# Patient Record
Sex: Male | Born: 1960 | Race: White | Hispanic: No | Marital: Married | Smoking: Former smoker
Health system: Southern US, Community
[De-identification: ages and names within clinical notes are randomized; demographics above are authoritative.]

## PROBLEM LIST (undated history)

## (undated) DIAGNOSIS — K648 Other hemorrhoids: Secondary | ICD-10-CM

## (undated) DIAGNOSIS — E785 Hyperlipidemia, unspecified: Secondary | ICD-10-CM

## (undated) DIAGNOSIS — M1990 Unspecified osteoarthritis, unspecified site: Secondary | ICD-10-CM

## (undated) DIAGNOSIS — Z86010 Personal history of colonic polyps: Secondary | ICD-10-CM

## (undated) DIAGNOSIS — T7840XA Allergy, unspecified, initial encounter: Secondary | ICD-10-CM

## (undated) DIAGNOSIS — K219 Gastro-esophageal reflux disease without esophagitis: Secondary | ICD-10-CM

## (undated) DIAGNOSIS — I10 Essential (primary) hypertension: Secondary | ICD-10-CM

## (undated) DIAGNOSIS — K5790 Diverticulosis of intestine, part unspecified, without perforation or abscess without bleeding: Secondary | ICD-10-CM

## (undated) DIAGNOSIS — E119 Type 2 diabetes mellitus without complications: Secondary | ICD-10-CM

## (undated) DIAGNOSIS — C801 Malignant (primary) neoplasm, unspecified: Secondary | ICD-10-CM

## (undated) DIAGNOSIS — Z860101 Personal history of adenomatous and serrated colon polyps: Secondary | ICD-10-CM

## (undated) DIAGNOSIS — D509 Iron deficiency anemia, unspecified: Secondary | ICD-10-CM

## (undated) HISTORY — PX: CERVICAL SPINE SURGERY: SHX589

## (undated) HISTORY — PX: OTHER SURGICAL HISTORY: SHX169

## (undated) HISTORY — DX: Malignant (primary) neoplasm, unspecified: C80.1

## (undated) HISTORY — DX: Personal history of adenomatous and serrated colon polyps: Z86.0101

## (undated) HISTORY — DX: Other hemorrhoids: K64.8

## (undated) HISTORY — DX: Type 2 diabetes mellitus without complications: E11.9

## (undated) HISTORY — DX: Hyperlipidemia, unspecified: E78.5

## (undated) HISTORY — PX: COLONOSCOPY: SHX174

## (undated) HISTORY — PX: APPENDECTOMY: SHX54

## (undated) HISTORY — DX: Personal history of colonic polyps: Z86.010

## (undated) HISTORY — DX: Unspecified osteoarthritis, unspecified site: M19.90

## (undated) HISTORY — PX: POLYPECTOMY: SHX149

## (undated) HISTORY — DX: Gastro-esophageal reflux disease without esophagitis: K21.9

## (undated) HISTORY — PX: MULTIPLE TOOTH EXTRACTIONS: SHX2053

## (undated) HISTORY — DX: Iron deficiency anemia, unspecified: D50.9

## (undated) HISTORY — DX: Diverticulosis of intestine, part unspecified, without perforation or abscess without bleeding: K57.90

## (undated) HISTORY — PX: ELBOW SURGERY: SHX618

## (undated) HISTORY — DX: Essential (primary) hypertension: I10

## (undated) HISTORY — DX: Allergy, unspecified, initial encounter: T78.40XA

---

## 2000-07-31 ENCOUNTER — Emergency Department (HOSPITAL_COMMUNITY): Admission: EM | Admit: 2000-07-31 | Discharge: 2000-07-31 | Payer: Self-pay | Admitting: Emergency Medicine

## 2001-06-23 ENCOUNTER — Emergency Department (HOSPITAL_COMMUNITY): Admission: EM | Admit: 2001-06-23 | Discharge: 2001-06-23 | Payer: Self-pay | Admitting: *Deleted

## 2001-12-18 ENCOUNTER — Emergency Department (HOSPITAL_COMMUNITY): Admission: EM | Admit: 2001-12-18 | Discharge: 2001-12-18 | Payer: Self-pay | Admitting: *Deleted

## 2001-12-26 ENCOUNTER — Emergency Department (HOSPITAL_COMMUNITY): Admission: EM | Admit: 2001-12-26 | Discharge: 2001-12-26 | Payer: Self-pay | Admitting: Emergency Medicine

## 2007-06-26 ENCOUNTER — Ambulatory Visit (HOSPITAL_COMMUNITY): Admission: RE | Admit: 2007-06-26 | Discharge: 2007-06-26 | Payer: Self-pay | Admitting: Neurosurgery

## 2009-10-31 ENCOUNTER — Ambulatory Visit: Payer: Self-pay | Admitting: Cardiology

## 2009-10-31 ENCOUNTER — Observation Stay (HOSPITAL_COMMUNITY): Admission: EM | Admit: 2009-10-31 | Discharge: 2009-11-01 | Payer: Self-pay | Admitting: Emergency Medicine

## 2009-11-06 ENCOUNTER — Emergency Department (HOSPITAL_COMMUNITY): Admission: EM | Admit: 2009-11-06 | Discharge: 2009-11-07 | Payer: Self-pay | Admitting: Emergency Medicine

## 2009-11-13 ENCOUNTER — Ambulatory Visit (HOSPITAL_COMMUNITY): Admission: RE | Admit: 2009-11-13 | Discharge: 2009-11-13 | Payer: Self-pay | Admitting: Cardiovascular Disease

## 2010-12-08 LAB — CBC
HCT: 45.2 % (ref 39.0–52.0)
Hemoglobin: 15.6 g/dL (ref 13.0–17.0)
Hemoglobin: 15.8 g/dL (ref 13.0–17.0)
MCHC: 34.8 g/dL (ref 30.0–36.0)
MCHC: 34.9 g/dL (ref 30.0–36.0)
MCHC: 35 g/dL (ref 30.0–36.0)
MCV: 90.9 fL (ref 78.0–100.0)
MCV: 91.2 fL (ref 78.0–100.0)
Platelets: 235 K/uL (ref 150–400)
Platelets: 244 10*3/uL (ref 150–400)
RBC: 4.97 MIL/uL (ref 4.22–5.81)
RDW: 12.6 % (ref 11.5–15.5)
RDW: 12.6 % (ref 11.5–15.5)
WBC: 12.3 10*3/uL — ABNORMAL HIGH (ref 4.0–10.5)
WBC: 13.5 10*3/uL — ABNORMAL HIGH (ref 4.0–10.5)
WBC: 9.7 10*3/uL (ref 4.0–10.5)

## 2010-12-08 LAB — POCT I-STAT, CHEM 8
BUN: 18 mg/dL (ref 6–23)
Calcium, Ion: 1.15 mmol/L (ref 1.12–1.32)
Chloride: 105 mEq/L (ref 96–112)
Glucose, Bld: 94 mg/dL (ref 70–99)
Potassium: 4.1 mEq/L (ref 3.5–5.1)
Sodium: 137 mEq/L (ref 135–145)
TCO2: 26 mmol/L (ref 0–100)

## 2010-12-08 LAB — BASIC METABOLIC PANEL
CO2: 26 mEq/L (ref 19–32)
Creatinine, Ser: 1.28 mg/dL (ref 0.4–1.5)
Glucose, Bld: 91 mg/dL (ref 70–99)
Potassium: 4.6 mEq/L (ref 3.5–5.1)
Sodium: 136 mEq/L (ref 135–145)

## 2010-12-08 LAB — TROPONIN I
Troponin I: 0.01 ng/mL (ref 0.00–0.06)
Troponin I: <0.01 ng/mL (ref 0.00–0.06)

## 2010-12-08 LAB — URINALYSIS, ROUTINE W REFLEX MICROSCOPIC
Glucose, UA: NEGATIVE mg/dL
Nitrite: NEGATIVE
Protein, ur: NEGATIVE mg/dL
Urobilinogen, UA: 0.2 mg/dL (ref 0.0–1.0)

## 2010-12-08 LAB — CK TOTAL AND CKMB (NOT AT ARMC)
CK, MB: 0.9 ng/mL (ref 0.3–4.0)
CK, MB: 1.7 ng/mL (ref 0.3–4.0)
Relative Index: 0.8 (ref 0.0–2.5)
Relative Index: 1 (ref 0.0–2.5)
Total CK: 115 U/L (ref 7–232)
Total CK: 162 U/L (ref 7–232)

## 2010-12-08 LAB — PROTIME-INR
INR: 0.92 (ref 0.00–1.49)
INR: 0.95 (ref 0.00–1.49)
Prothrombin Time: 12.3 seconds (ref 11.6–15.2)
Prothrombin Time: 12.6 seconds (ref 11.6–15.2)

## 2010-12-08 LAB — COMPREHENSIVE METABOLIC PANEL
ALT: 36 U/L (ref 0–53)
AST: 24 U/L (ref 0–37)
Alkaline Phosphatase: 49 U/L (ref 39–117)
BUN: 16 mg/dL (ref 6–23)
CO2: 24 mEq/L (ref 19–32)
Calcium: 10 mg/dL (ref 8.4–10.5)
Chloride: 104 mEq/L (ref 96–112)
GFR calc Af Amer: >60 mL/min (ref >60)
GFR calc non Af Amer: >60 mL/min (ref >60)
Potassium: 4.2 mEq/L (ref 3.5–5.1)
Sodium: 136 mEq/L (ref 135–145)
Total Bilirubin: 0.4 mg/dL (ref 0.3–1.2)

## 2010-12-08 LAB — DIFFERENTIAL
Basophils Absolute: 0 10*3/uL (ref 0.0–0.1)
Basophils Absolute: 0.1 10*3/uL (ref 0.0–0.1)
Basophils Relative: 0 % (ref 0–1)
Eosinophils Absolute: 0 10*3/uL (ref 0.0–0.7)
Eosinophils Relative: 0 % (ref 0–5)
Lymphocytes Relative: 15 % (ref 12–46)
Lymphocytes Relative: 19 % (ref 12–46)
Lymphs Abs: 1.8 K/uL (ref 0.7–4.0)
Lymphs Abs: 2.6 10*3/uL (ref 0.7–4.0)
Monocytes Absolute: 0.5 K/uL (ref 0.1–1.0)
Monocytes Relative: 4 % (ref 3–12)
Neutro Abs: 10.1 10*3/uL — ABNORMAL HIGH (ref 1.7–7.7)
Neutro Abs: 9.9 10*3/uL — ABNORMAL HIGH (ref 1.7–7.7)
Neutrophils Relative %: 81 % — ABNORMAL HIGH (ref 43–77)

## 2010-12-08 LAB — COMPREHENSIVE METABOLIC PANEL WITH GFR
Albumin: 4.6 g/dL (ref 3.5–5.2)
Creatinine, Ser: 1 mg/dL (ref 0.4–1.5)
Glucose, Bld: 101 mg/dL — ABNORMAL HIGH (ref 70–99)
Total Protein: 7.8 g/dL (ref 6.0–8.3)

## 2010-12-08 LAB — CARDIAC PANEL(CRET KIN+CKTOT+MB+TROPI)
CK, MB: 1 ng/mL (ref 0.3–4.0)
CK, MB: 1.2 ng/mL (ref 0.3–4.0)
Troponin I: <0.01 ng/mL (ref 0.00–0.06)

## 2010-12-08 LAB — APTT: aPTT: 32 seconds (ref 24–37)

## 2010-12-08 LAB — TSH: TSH: 3.379 u[IU]/mL (ref 0.350–4.500)

## 2011-02-01 NOTE — Op Note (Signed)
[name], Steven Gates                ACCOUNT NO.:  [id number]   MEDICAL RECORD NO.:  [id number]          PATIENT TYPE:  AMB   LOCATION:  SDS                          FACILITY:  [facility]   PHYSICIAN:  Clydene Fake, M.D.  DATE OF BIRTH:  Jul 15, 1961   DATE OF PROCEDURE:  06/26/2007  DATE OF DISCHARGE:                               OPERATIVE REPORT   DIAGNOSIS:  Left ulnar neuropathy.   POSTOPERATIVE DIAGNOSIS:  Left ulnar neuropathy.   PROCEDURE:  Left ulnar nerve transposition.   SURGEON:  Clydene Fake, M.D.   General endotracheal tube anesthesia.   ESTIMATED BLOOD LOSS:  Minimal.   BLOOD GIVEN:  None.   DRAINS:  None.   INDICATIONS FOR PROCEDURE:  The patient is a 50 year old gentleman who  has been having left arm numbness, weakness, progression of atrophy.  EMG nerve conduction velocity showed a severe left ulnar nerve  compression at the elbow.  The was patient brought in for ulnar nerve  decompression and transposition.   PROCEDURE IN DETAIL:  The patient was brought into the operating room,  general anesthesia was induced.  The patient was prepped and draped in a  sterile fashion.  An incision was made down the left arm lateral into  the medial epicondyle.  Incision through the skin, hemostasis was  obtained with Bovie cauterization.  We dissected out to the medial  epicondyle, found the ulnar nerve and exposed the nerve, put a vessel  loop a couple of them around the nerve and mobilized the nerve root.  I  mobilized it up proximally so that we could move the nerve laterally.  There appeared intermuscular septum was then under the nerve and this  was transected so that there was no compression.  Then the ulnar nerve  in its new position, we dissected distally to the nerve out of the ulnar  groove.  The vessel lies over the groove so the nerve could not take it  back into it.  We then fashioned a fascia sling for the nerve in its new  position, sutured this with 2-0  Vicryl interrupted suture, then closed  the skin with 2-0 and 3-0 Vicryl interrupted sutures.  The skin closed  with benzoin and Steri-Strips.  Dressing placed and the patient awoken  from anesthesia and transferred to the recovery room in stable  condition.           ______________________________  Clydene Fake, M.D.     JRH/MEDQ  D:  06/26/2007  T:  06/26/2007  Job:  [id number]

## 2011-03-01 IMAGING — CR DG CHEST 1V PORT
1 series · 1 of 1 positions shown · non-contrast
Comparison: Chest x-ray of 06/26/2007

CLINICAL DATA: Chest pain radiating to the left shoulder,
hypertension

PORTABLE CHEST - 1 VIEW

[AP]
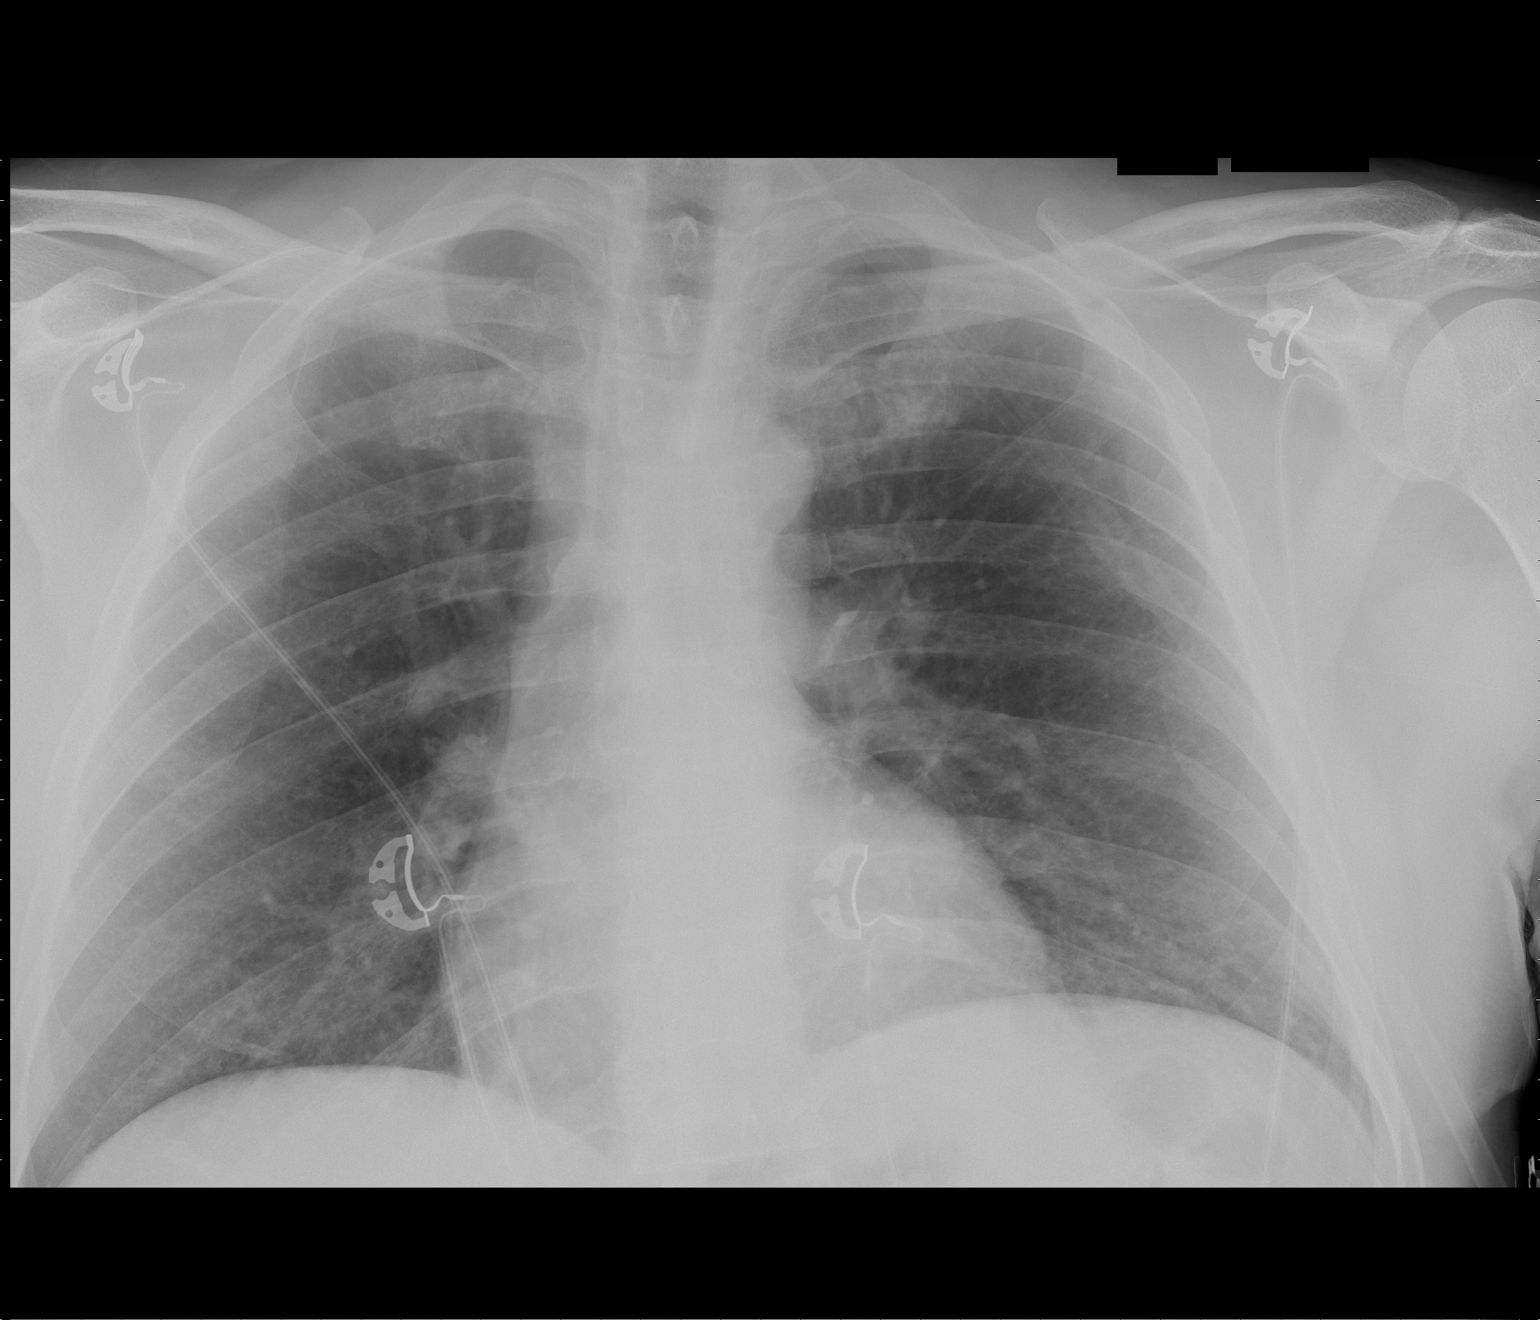

[1 of 1 positions shown; findings below may reference images not displayed]

FINDINGS: The lungs are clear.  The heart is within normal limits
in size.  No bony abnormality is seen.
IMPRESSION: Stable chest x-ray.  No active lung disease.

## 2011-03-25 ENCOUNTER — Other Ambulatory Visit: Payer: Self-pay | Admitting: Orthopaedic Surgery

## 2011-03-25 DIAGNOSIS — R202 Paresthesia of skin: Secondary | ICD-10-CM

## 2011-03-25 DIAGNOSIS — M542 Cervicalgia: Secondary | ICD-10-CM

## 2011-03-25 DIAGNOSIS — R200 Anesthesia of skin: Secondary | ICD-10-CM

## 2011-03-28 ENCOUNTER — Ambulatory Visit
Admission: RE | Admit: 2011-03-28 | Discharge: 2011-03-28 | Disposition: A | Discharge status: Home / Self Care | Payer: BC Managed Care – PPO | Source: Ambulatory Visit | Attending: Orthopaedic Surgery | Admitting: Orthopaedic Surgery

## 2011-03-28 DIAGNOSIS — M542 Cervicalgia: Secondary | ICD-10-CM

## 2011-03-28 DIAGNOSIS — R200 Anesthesia of skin: Secondary | ICD-10-CM

## 2011-06-30 LAB — URINALYSIS, ROUTINE W REFLEX MICROSCOPIC
Bilirubin Urine: NEGATIVE
Hgb urine dipstick: NEGATIVE
Nitrite: NEGATIVE
Specific Gravity, Urine: 1.017
pH: 6

## 2011-06-30 LAB — COMPREHENSIVE METABOLIC PANEL
AST: 25
Albumin: 4.2
Alkaline Phosphatase: 51
Chloride: 104
GFR calc Af Amer: >60
Potassium: 4.1
Sodium: 138
Total Bilirubin: 0.7

## 2011-06-30 LAB — CBC
Platelets: 275
WBC: 7.4

## 2011-11-21 ENCOUNTER — Ambulatory Visit
Admission: RE | Admit: 2011-11-21 | Discharge: 2011-11-21 | Disposition: A | Discharge status: Home / Self Care | Payer: BC Managed Care – PPO | Source: Ambulatory Visit | Attending: Neurological Surgery | Admitting: Neurological Surgery

## 2011-11-21 ENCOUNTER — Other Ambulatory Visit: Payer: Self-pay | Admitting: Neurological Surgery

## 2011-11-21 DIAGNOSIS — M542 Cervicalgia: Secondary | ICD-10-CM

## 2013-01-23 ENCOUNTER — Other Ambulatory Visit: Payer: Self-pay | Admitting: Neurological Surgery

## 2013-01-23 DIAGNOSIS — M545 Low back pain, unspecified: Secondary | ICD-10-CM

## 2013-01-26 ENCOUNTER — Ambulatory Visit
Admission: RE | Admit: 2013-01-26 | Discharge: 2013-01-26 | Disposition: A | Discharge status: Home / Self Care | Payer: BC Managed Care – PPO | Source: Ambulatory Visit | Attending: Neurological Surgery | Admitting: Neurological Surgery

## 2013-01-26 DIAGNOSIS — M545 Low back pain, unspecified: Secondary | ICD-10-CM

## 2013-02-08 ENCOUNTER — Other Ambulatory Visit: Payer: Self-pay | Admitting: Orthopedic Surgery

## 2013-02-08 DIAGNOSIS — M549 Dorsalgia, unspecified: Secondary | ICD-10-CM

## 2013-02-12 ENCOUNTER — Ambulatory Visit
Admission: RE | Admit: 2013-02-12 | Discharge: 2013-02-12 | Disposition: A | Discharge status: Home / Self Care | Payer: BC Managed Care – PPO | Source: Ambulatory Visit | Attending: Orthopedic Surgery | Admitting: Orthopedic Surgery

## 2013-02-12 VITALS — BP 121/76 | HR 61

## 2013-02-12 DIAGNOSIS — M549 Dorsalgia, unspecified: Secondary | ICD-10-CM

## 2013-02-12 MED ORDER — MEPERIDINE HCL 100 MG/ML IJ SOLN
75.0000 mg | Freq: Once | INTRAMUSCULAR | Status: AC
  Administered 2013-02-12: 75 mg via INTRAMUSCULAR

## 2013-02-12 MED ORDER — IOHEXOL 180 MG/ML  SOLN
15.0000 mL | Freq: Once | INTRAMUSCULAR | Status: AC | PRN
  Administered 2013-02-12: 15 mL via INTRATHECAL

## 2013-02-12 MED ORDER — ONDANSETRON HCL 4 MG/2ML IJ SOLN
4.0000 mg | Freq: Once | INTRAMUSCULAR | Status: AC
  Administered 2013-02-12: 4 mg via INTRAMUSCULAR

## 2013-02-12 MED ORDER — DIAZEPAM 5 MG PO TABS
10.0000 mg | ORAL_TABLET | Freq: Once | ORAL | Status: AC
  Administered 2013-02-12: 10 mg via ORAL

## 2013-02-18 ENCOUNTER — Other Ambulatory Visit: Payer: BC Managed Care – PPO

## 2013-02-19 ENCOUNTER — Other Ambulatory Visit: Payer: BC Managed Care – PPO

## 2014-03-10 ENCOUNTER — Ambulatory Visit
Admission: RE | Admit: 2014-03-10 | Discharge: 2014-03-10 | Disposition: A | Discharge status: Home / Self Care | Payer: BC Managed Care – PPO | Source: Ambulatory Visit | Attending: Family Medicine | Admitting: Family Medicine

## 2014-03-10 ENCOUNTER — Other Ambulatory Visit: Payer: Self-pay | Admitting: Family Medicine

## 2014-03-10 DIAGNOSIS — M25552 Pain in left hip: Secondary | ICD-10-CM

## 2014-03-10 DIAGNOSIS — M79644 Pain in right finger(s): Secondary | ICD-10-CM

## 2014-04-18 ENCOUNTER — Other Ambulatory Visit (HOSPITAL_COMMUNITY): Payer: Self-pay | Admitting: Orthopedic Surgery

## 2014-04-18 DIAGNOSIS — D481 Neoplasm of uncertain behavior of connective and other soft tissue: Secondary | ICD-10-CM

## 2014-05-09 ENCOUNTER — Ambulatory Visit (HOSPITAL_COMMUNITY)
Admission: RE | Admit: 2014-05-09 | Discharge: 2014-05-09 | Disposition: A | Discharge status: Home / Self Care | Payer: BC Managed Care – PPO | Source: Ambulatory Visit | Attending: Orthopedic Surgery | Admitting: Orthopedic Surgery

## 2014-05-09 DIAGNOSIS — D481 Neoplasm of uncertain behavior of connective and other soft tissue: Secondary | ICD-10-CM

## 2014-05-09 DIAGNOSIS — M799 Soft tissue disorder, unspecified: Secondary | ICD-10-CM | POA: Diagnosis not present

## 2017-08-07 DIAGNOSIS — Z23 Encounter for immunization: Secondary | ICD-10-CM | POA: Diagnosis not present

## 2017-08-07 DIAGNOSIS — K219 Gastro-esophageal reflux disease without esophagitis: Secondary | ICD-10-CM | POA: Diagnosis not present

## 2017-08-07 DIAGNOSIS — E785 Hyperlipidemia, unspecified: Secondary | ICD-10-CM | POA: Diagnosis not present

## 2017-08-07 DIAGNOSIS — I10 Essential (primary) hypertension: Secondary | ICD-10-CM | POA: Diagnosis not present

## 2017-08-07 DIAGNOSIS — E119 Type 2 diabetes mellitus without complications: Secondary | ICD-10-CM | POA: Diagnosis not present

## 2018-01-25 DIAGNOSIS — I10 Essential (primary) hypertension: Secondary | ICD-10-CM | POA: Diagnosis not present

## 2018-01-25 DIAGNOSIS — Z23 Encounter for immunization: Secondary | ICD-10-CM | POA: Diagnosis not present

## 2018-01-25 DIAGNOSIS — Z125 Encounter for screening for malignant neoplasm of prostate: Secondary | ICD-10-CM | POA: Diagnosis not present

## 2018-01-25 DIAGNOSIS — Z0000 Encounter for general adult medical examination without abnormal findings: Secondary | ICD-10-CM | POA: Diagnosis not present

## 2018-01-25 DIAGNOSIS — E7800 Pure hypercholesterolemia, unspecified: Secondary | ICD-10-CM | POA: Diagnosis not present

## 2018-01-25 DIAGNOSIS — E119 Type 2 diabetes mellitus without complications: Secondary | ICD-10-CM | POA: Diagnosis not present

## 2018-01-26 ENCOUNTER — Encounter: Payer: Self-pay | Admitting: Internal Medicine

## 2018-02-09 DIAGNOSIS — D485 Neoplasm of uncertain behavior of skin: Secondary | ICD-10-CM | POA: Diagnosis not present

## 2018-02-09 DIAGNOSIS — D2261 Melanocytic nevi of right upper limb, including shoulder: Secondary | ICD-10-CM | POA: Diagnosis not present

## 2018-02-09 DIAGNOSIS — D225 Melanocytic nevi of trunk: Secondary | ICD-10-CM | POA: Diagnosis not present

## 2018-02-09 DIAGNOSIS — L814 Other melanin hyperpigmentation: Secondary | ICD-10-CM | POA: Diagnosis not present

## 2018-02-09 DIAGNOSIS — C4441 Basal cell carcinoma of skin of scalp and neck: Secondary | ICD-10-CM | POA: Diagnosis not present

## 2018-02-09 DIAGNOSIS — D2271 Melanocytic nevi of right lower limb, including hip: Secondary | ICD-10-CM | POA: Diagnosis not present

## 2018-02-09 DIAGNOSIS — L918 Other hypertrophic disorders of the skin: Secondary | ICD-10-CM | POA: Diagnosis not present

## 2018-03-06 DIAGNOSIS — C4441 Basal cell carcinoma of skin of scalp and neck: Secondary | ICD-10-CM | POA: Diagnosis not present

## 2018-04-05 ENCOUNTER — Other Ambulatory Visit: Payer: Self-pay

## 2018-04-05 ENCOUNTER — Ambulatory Visit (AMBULATORY_SURGERY_CENTER): Payer: Self-pay | Admitting: *Deleted

## 2018-04-05 VITALS — Ht 65.0 in | Wt 169.8 lb

## 2018-04-05 DIAGNOSIS — Z1211 Encounter for screening for malignant neoplasm of colon: Secondary | ICD-10-CM

## 2018-04-05 MED ORDER — SUPREP BOWEL PREP KIT 17.5-3.13-1.6 GM/177ML PO SOLN: 1.0000 | Freq: Once | ORAL | 0 refills | Status: AC

## 2018-04-05 NOTE — Progress Notes (Signed)
Patient denies any allergies to egg or soy products. Patient denies complications with anesthesia/sedation.  Patient denies oxygen use at home and denies diet medications. Pamphlet given on colonoscopy. 

## 2018-04-19 ENCOUNTER — Encounter: Payer: Self-pay | Admitting: Internal Medicine

## 2018-04-19 ENCOUNTER — Ambulatory Visit (AMBULATORY_SURGERY_CENTER): Payer: BLUE CROSS/BLUE SHIELD | Admitting: Internal Medicine

## 2018-04-19 VITALS — BP 115/89 | HR 62 | Temp 98.9°F | Resp 12 | Ht 65.0 in | Wt 169.0 lb

## 2018-04-19 DIAGNOSIS — D123 Benign neoplasm of transverse colon: Secondary | ICD-10-CM | POA: Diagnosis not present

## 2018-04-19 DIAGNOSIS — D125 Benign neoplasm of sigmoid colon: Secondary | ICD-10-CM

## 2018-04-19 DIAGNOSIS — D124 Benign neoplasm of descending colon: Secondary | ICD-10-CM | POA: Diagnosis not present

## 2018-04-19 DIAGNOSIS — Z1211 Encounter for screening for malignant neoplasm of colon: Secondary | ICD-10-CM | POA: Diagnosis not present

## 2018-04-19 DIAGNOSIS — K635 Polyp of colon: Secondary | ICD-10-CM

## 2018-04-19 MED ORDER — SODIUM CHLORIDE 0.9 % IV SOLN: 500.0000 mL | Freq: Once | INTRAVENOUS | Status: DC

## 2018-04-19 NOTE — Progress Notes (Signed)
Report to PACU, RN, vss, BBS= Clear.  

## 2018-04-19 NOTE — Progress Notes (Signed)
Pt's states no medical or surgical changes since previsit or office visit. 

## 2018-04-19 NOTE — Patient Instructions (Signed)
**   Handouts given on polyps and diverticulosis **   YOU HAD AN ENDOSCOPIC PROCEDURE TODAY AT THE [facility]:   Refer to the procedure report that was given to you for any specific questions about what was found during the examination.  If the procedure report does not answer your questions, please call your gastroenterologist to clarify.  If you requested that your care partner not be given the details of your procedure findings, then the procedure report has been included in a sealed envelope for you to review at your convenience later.  YOU SHOULD EXPECT: Some feelings of bloating in the abdomen. Passage of more gas than usual.  Walking can help get rid of the air that was put into your GI tract during the procedure and reduce the bloating. If you had a lower endoscopy (such as a colonoscopy or flexible sigmoidoscopy) you may notice spotting of blood in your stool or on the toilet paper. If you underwent a bowel prep for your procedure, you may not have a normal bowel movement for a few days.  Please Note:  You might notice some irritation and congestion in your nose or some drainage.  This is from the oxygen used during your procedure.  There is no need for concern and it should clear up in a day or so.  SYMPTOMS TO REPORT IMMEDIATELY:   Following lower endoscopy (colonoscopy or flexible sigmoidoscopy):  Excessive amounts of blood in the stool  Significant tenderness or worsening of abdominal pains  Swelling of the abdomen that is new, acute  Fever of 100F or higher  For urgent or emergent issues, a gastroenterologist can be reached at any hour by calling [phone].   DIET:  We do recommend a small meal at first, but then you may proceed to your regular diet.  Drink plenty of fluids but you should avoid alcoholic beverages for 24 hours.  ACTIVITY:  You should plan to take it easy for the rest of today and you should NOT DRIVE or use heavy machinery until tomorrow (because  of the sedation medicines used during the test).    FOLLOW UP: Our staff will call the number listed on your records the next business day following your procedure to check on you and address any questions or concerns that you may have regarding the information given to you following your procedure. If we do not reach you, we will leave a message.  However, if you are feeling well and you are not experiencing any problems, there is no need to return our call.  We will assume that you have returned to your regular daily activities without incident.  If any biopsies were taken you will be contacted by phone or by letter within the next 1-3 weeks.  Please call us at [phone] if you have not heard about the biopsies in 3 weeks.    SIGNATURES/CONFIDENTIALITY: You and/or your care partner have signed paperwork which will be entered into your electronic medical record.  These signatures attest to the fact that that the information above on your After Visit Summary has been reviewed and is understood.  Full responsibility of the confidentiality of this discharge information lies with you and/or your care-partner. 

## 2018-04-19 NOTE — Progress Notes (Signed)
Called to room to assist during endoscopic procedure.  Patient ID and intended procedure confirmed with present staff. Received instructions for my participation in the procedure from the performing physician.  

## 2018-04-19 NOTE — Op Note (Signed)
[place] Patient Name: Steven Gates Procedure Date: 04/19/2018 11:21 AM MRN: [id number] Endoscopist: Jerene Bears , MD Age: 57 Referring MD:  Date of Birth: 09-28-1960 Gender: Male Account #: [id number] Procedure:                Colonoscopy Indications:              Screening for colorectal malignant neoplasm Medicines:                Monitored Anesthesia Care Procedure:                Pre-Anesthesia Assessment:                           - Prior to the procedure, a History and Physical                            was performed, and patient medications and                            allergies were reviewed. The patient's tolerance of                            previous anesthesia was also reviewed. The risks                            and benefits of the procedure and the sedation                            options and risks were discussed with the patient.                            All questions were answered, and informed consent                            was obtained. Prior Anticoagulants: The patient has                            taken no previous anticoagulant or antiplatelet                            agents. ASA Grade Assessment: II - A patient with                            mild systemic disease. After reviewing the risks                            and benefits, the patient was deemed in                            satisfactory condition to undergo the procedure.                           After obtaining informed consent, the colonoscope  was passed under direct vision. Throughout the                            procedure, the patient's blood pressure, pulse, and                            oxygen saturations were monitored continuously. The                            Colonoscope was introduced through the anus and                            advanced to the cecum, identified by appendiceal                            orifice and ileocecal  valve. The colonoscopy was                            performed without difficulty. The patient tolerated                            the procedure well. The quality of the bowel                            preparation was good. The ileocecal valve,                            appendiceal orifice, and rectum were photographed. Scope In: 11:28:00 AM Scope Out: 11:52:23 AM Scope Withdrawal Time: 0 hours 18 minutes 50 seconds  Total Procedure Duration: 0 hours 24 minutes 23 seconds  Findings:                 The digital rectal exam was normal.                           Seven sessile polyps were found in the transverse                            colon. The polyps were 3 to 7 mm in size. These                            polyps were removed with a cold snare. Resection                            and retrieval were complete.                           Two sessile polyps were found in the descending                            colon. The polyps were 3 to 6 mm in size. These                            polyps  were removed with a cold snare. Resection                            and retrieval were complete.                           Three sessile polyps were found in the sigmoid                            colon. The polyps were 4 to 6 mm in size. These                            polyps were removed with a cold snare. Resection                            and retrieval were complete.                           Multiple small and large-mouthed diverticula were                            found in the sigmoid colon and descending colon.                            There is angulation in the rectosigmoid due to                            diverticulosis.                           Internal hemorrhoids were found during                            retroflexion. The hemorrhoids were small. Complications:            No immediate complications. Estimated Blood Loss:     Estimated blood loss was minimal. Impression:                - Seven 3 to 7 mm polyps in the transverse colon,                            removed with a cold snare. Resected and retrieved.                           - Two 3 to 6 mm polyps in the descending colon,                            removed with a cold snare. Resected and retrieved.                           - Three 4 to 6 mm polyps in the sigmoid colon,                            removed with a cold snare. Resected  and retrieved.                           - Moderate diverticulosis in the sigmoid colon and                            in the descending colon.                           - Small internal hemorrhoids. Recommendation:           - Patient has a contact number available for                            emergencies. The signs and symptoms of potential                            delayed complications were discussed with the                            patient. Return to normal activities tomorrow.                            Written discharge instructions were provided to the                            patient.                           - Resume previous diet.                           - Continue present medications.                           - Await pathology results.                           - Repeat colonoscopy is recommended for                            surveillance of multiple polyps. The colonoscopy                            date will be determined after pathology results                            from today's exam become available for review. Jerene Bears, MD 04/19/2018 11:57:00 AM This report has been signed electronically.

## 2018-04-20 ENCOUNTER — Telehealth: Payer: Self-pay

## 2018-04-20 NOTE — Telephone Encounter (Signed)
  Follow up Call-  Call back number 04/19/2018  Post procedure Call Back phone  # [phone]  Permission to leave phone message Yes  Some recent data might be hidden     Patient questions:  Do you have a fever, pain , or abdominal swelling? No. Pain Score  0 *  Have you tolerated food without any problems? Yes.    Have you been able to return to your normal activities? Yes.    Do you have any questions about your discharge instructions: Diet   No. Medications  No. Follow up visit  No.  Do you have questions or concerns about your Care? No.  Actions: * If pain score is 4 or above: No action needed, pain <4.

## 2018-04-30 ENCOUNTER — Encounter: Payer: Self-pay | Admitting: Internal Medicine

## 2018-10-31 DIAGNOSIS — E119 Type 2 diabetes mellitus without complications: Secondary | ICD-10-CM | POA: Diagnosis not present

## 2018-10-31 DIAGNOSIS — J101 Influenza due to other identified influenza virus with other respiratory manifestations: Secondary | ICD-10-CM | POA: Diagnosis not present

## 2018-10-31 DIAGNOSIS — E7800 Pure hypercholesterolemia, unspecified: Secondary | ICD-10-CM | POA: Diagnosis not present

## 2018-10-31 DIAGNOSIS — R6889 Other general symptoms and signs: Secondary | ICD-10-CM | POA: Diagnosis not present

## 2018-10-31 DIAGNOSIS — J019 Acute sinusitis, unspecified: Secondary | ICD-10-CM | POA: Diagnosis not present

## 2019-01-31 DIAGNOSIS — Z0000 Encounter for general adult medical examination without abnormal findings: Secondary | ICD-10-CM | POA: Diagnosis not present

## 2019-01-31 DIAGNOSIS — E785 Hyperlipidemia, unspecified: Secondary | ICD-10-CM | POA: Diagnosis not present

## 2019-01-31 DIAGNOSIS — E119 Type 2 diabetes mellitus without complications: Secondary | ICD-10-CM | POA: Diagnosis not present

## 2019-01-31 DIAGNOSIS — I10 Essential (primary) hypertension: Secondary | ICD-10-CM | POA: Diagnosis not present

## 2019-01-31 DIAGNOSIS — K219 Gastro-esophageal reflux disease without esophagitis: Secondary | ICD-10-CM | POA: Diagnosis not present

## 2019-02-22 DIAGNOSIS — Z125 Encounter for screening for malignant neoplasm of prostate: Secondary | ICD-10-CM | POA: Diagnosis not present

## 2019-02-22 DIAGNOSIS — E119 Type 2 diabetes mellitus without complications: Secondary | ICD-10-CM | POA: Diagnosis not present

## 2019-02-22 DIAGNOSIS — I10 Essential (primary) hypertension: Secondary | ICD-10-CM | POA: Diagnosis not present

## 2019-04-20 ENCOUNTER — Encounter: Payer: Self-pay | Admitting: Internal Medicine

## 2019-05-13 DIAGNOSIS — T07XXXA Unspecified multiple injuries, initial encounter: Secondary | ICD-10-CM | POA: Diagnosis not present

## 2019-05-13 DIAGNOSIS — R079 Chest pain, unspecified: Secondary | ICD-10-CM | POA: Diagnosis not present

## 2019-06-14 ENCOUNTER — Encounter: Payer: Self-pay | Admitting: Internal Medicine

## 2019-06-25 DIAGNOSIS — E785 Hyperlipidemia, unspecified: Secondary | ICD-10-CM | POA: Insufficient documentation

## 2019-06-25 DIAGNOSIS — C801 Malignant (primary) neoplasm, unspecified: Secondary | ICD-10-CM | POA: Insufficient documentation

## 2019-06-25 DIAGNOSIS — E119 Type 2 diabetes mellitus without complications: Secondary | ICD-10-CM | POA: Insufficient documentation

## 2019-06-25 DIAGNOSIS — K219 Gastro-esophageal reflux disease without esophagitis: Secondary | ICD-10-CM | POA: Insufficient documentation

## 2019-06-25 DIAGNOSIS — I10 Essential (primary) hypertension: Secondary | ICD-10-CM | POA: Insufficient documentation

## 2019-07-08 ENCOUNTER — Other Ambulatory Visit: Payer: Self-pay

## 2019-07-08 ENCOUNTER — Ambulatory Visit (AMBULATORY_SURGERY_CENTER): Payer: BC Managed Care – PPO | Admitting: *Deleted

## 2019-07-08 VITALS — Temp 97.1°F | Ht 65.0 in | Wt 170.0 lb

## 2019-07-08 DIAGNOSIS — E119 Type 2 diabetes mellitus without complications: Secondary | ICD-10-CM

## 2019-07-08 DIAGNOSIS — C801 Malignant (primary) neoplasm, unspecified: Secondary | ICD-10-CM

## 2019-07-08 DIAGNOSIS — Z86010 Personal history of colonic polyps: Secondary | ICD-10-CM

## 2019-07-08 DIAGNOSIS — Z1159 Encounter for screening for other viral diseases: Secondary | ICD-10-CM

## 2019-07-08 MED ORDER — SUPREP BOWEL PREP KIT 17.5-3.13-1.6 GM/177ML PO SOLN: 1.0000 | Freq: Once | ORAL | 0 refills | Status: AC

## 2019-07-08 NOTE — Progress Notes (Signed)
No egg or soy allergy known to patient  No issues with past sedation with any surgeries  or procedures, no intubation problems  No diet pills per patient No home 02 use per patient  No blood thinners per patient  Pt denies issues with constipation  No A fib or A flutter  EMMI video sent to pt's e mail  Suprep $15 coupon to pt   Due to the COVID-19 pandemic we are asking patients to follow these guidelines. Please only bring one care partner. Please be aware that your care partner may wait in the car in the parking lot or if they feel like they will be too hot to wait in the car, they may wait in the lobby on the 4th floor. All care partners are required to wear a mask the entire time (we do not have any that we can provide them), they need to practice social distancing, and we will do a Covid check for all patient's and care partners when you arrive. Also we will check their temperature and your temperature. If the care partner waits in their car they need to stay in the parking lot the entire time and we will call them on their cell phone when the patient is ready for discharge so they can bring the car to the front of the building. Also all patient's will need to wear a mask into building.

## 2019-07-09 ENCOUNTER — Encounter: Payer: Self-pay | Admitting: Internal Medicine

## 2019-07-17 ENCOUNTER — Other Ambulatory Visit: Payer: Self-pay | Admitting: Internal Medicine

## 2019-07-17 DIAGNOSIS — Z1159 Encounter for screening for other viral diseases: Secondary | ICD-10-CM | POA: Diagnosis not present

## 2019-07-18 LAB — SARS CORONAVIRUS 2 (TAT 6-24 HRS): SARS Coronavirus 2: NEGATIVE

## 2019-07-22 ENCOUNTER — Ambulatory Visit (AMBULATORY_SURGERY_CENTER): Payer: BC Managed Care – PPO | Admitting: Internal Medicine

## 2019-07-22 ENCOUNTER — Other Ambulatory Visit: Payer: Self-pay

## 2019-07-22 ENCOUNTER — Encounter: Payer: Self-pay | Admitting: Internal Medicine

## 2019-07-22 VITALS — BP 110/70 | HR 62 | Temp 97.7°F | Resp 18 | Ht 65.0 in | Wt 170.0 lb

## 2019-07-22 DIAGNOSIS — Z86010 Personal history of colonic polyps: Secondary | ICD-10-CM

## 2019-07-22 DIAGNOSIS — D124 Benign neoplasm of descending colon: Secondary | ICD-10-CM

## 2019-07-22 MED ORDER — SODIUM CHLORIDE 0.9 % IV SOLN: 500.0000 mL | Freq: Once | INTRAVENOUS | Status: DC

## 2019-07-22 NOTE — Patient Instructions (Signed)
YOU HAD AN ENDOSCOPIC PROCEDURE TODAY AT THE [facility]:   Refer to the procedure report that was given to you for any specific questions about what was found during the examination.  If the procedure report does not answer your questions, please call your gastroenterologist to clarify.  If you requested that your care partner not be given the details of your procedure findings, then the procedure report has been included in a sealed envelope for you to review at your convenience later.  YOU SHOULD EXPECT: Some feelings of bloating in the abdomen. Passage of more gas than usual.  Walking can help get rid of the air that was put into your GI tract during the procedure and reduce the bloating. If you had a lower endoscopy (such as a colonoscopy or flexible sigmoidoscopy) you may notice spotting of blood in your stool or on the toilet paper. If you underwent a bowel prep for your procedure, you may not have a normal bowel movement for a few days.  Please Note:  You might notice some irritation and congestion in your nose or some drainage.  This is from the oxygen used during your procedure.  There is no need for concern and it should clear up in a day or so.  SYMPTOMS TO REPORT IMMEDIATELY:   Following lower endoscopy (colonoscopy or flexible sigmoidoscopy):  Excessive amounts of blood in the stool  Significant tenderness or worsening of abdominal pains  Swelling of the abdomen that is new, acute  Fever of 100F or higher  For urgent or emergent issues, a gastroenterologist can be reached at any hour by calling [phone].   DIET:  We do recommend a small meal at first, but then you may proceed to your regular diet.  Drink plenty of fluids but you should avoid alcoholic beverages for 24 hours.  ACTIVITY:  You should plan to take it easy for the rest of today and you should NOT DRIVE or use heavy machinery until tomorrow (because of the sedation medicines used during the test).     FOLLOW UP: Our staff will call the number listed on your records 48-72 hours following your procedure to check on you and address any questions or concerns that you may have regarding the information given to you following your procedure. If we do not reach you, we will leave a message.  We will attempt to reach you two times.  During this call, we will ask if you have developed any symptoms of COVID 19. If you develop any symptoms (ie: fever, flu-like symptoms, shortness of breath, cough etc.) before then, please call [phone].  If you test positive for Covid 19 in the 2 weeks post procedure, please call and report this information to us.    If any biopsies were taken you will be contacted by phone or by letter within the next 1-3 weeks.  Please call us at [phone] if you have not heard about the biopsies in 3 weeks.    SIGNATURES/CONFIDENTIALITY: You and/or your care partner have signed paperwork which will be entered into your electronic medical record.  These signatures attest to the fact that that the information above on your After Visit Summary has been reviewed and is understood.  Full responsibility of the confidentiality of this discharge information lies with you and/or your care-partner. 

## 2019-07-22 NOTE — Progress Notes (Signed)
Called to room to assist during endoscopic procedure.  Patient ID and intended procedure confirmed with present staff. Received instructions for my participation in the procedure from the performing physician.  

## 2019-07-22 NOTE — Progress Notes (Signed)
A/ox3, pleased with MAC, report to RN 

## 2019-07-22 NOTE — Progress Notes (Signed)
Temp check by:LC Vital check by:CW  The patient states no changes in medical or surgical history since pre-visit screening on 07/08/2019.

## 2019-07-22 NOTE — Op Note (Signed)
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 The patient's tolerance of                            previous anesthesia was also reviewed. The risks                            and benefits of the procedure and the sedation                            options and risks were discussed with the patient.                            All questions were answered, and informed consent                            was obtained. Prior Anticoagulants: The patient has                            taken no previous anticoagulant or antiplatelet                            agents. ASA Grade Assessment: II - A patient with                            mild systemic disease. After reviewing the risks                            and benefits, the patient was deemed in                            satisfactory condition to undergo the procedure.  After obtaining informed consent, the colonoscope                            was passed under direct vision. Throughout the                            procedure, the patient's blood pressure, pulse, and                            oxygen saturations were monitored continuously. The                            Colonoscope was introduced through the anus and                            advanced to the  terminal ileum. The colonoscopy was                            performed without difficulty. The patient tolerated                            the procedure well. The quality of the bowel                            preparation was good. The terminal ileum, ileocecal                            valve, appendiceal orifice, and rectum were                            photographed. Scope In: 9:58:40 AM Scope Out: 10:17:49 AM Scope Withdrawal Time: 0 hours 14 minutes 6 seconds  Total Procedure Duration: 0 hours 19 minutes 9 seconds  Findings:                 The digital rectal exam was normal.                           A 4 mm polyp was found in the descending colon. The                            polyp was sessile. The polyp was removed with a                            cold snare. Resection and retrieval were complete.                           Multiple small and large-mouthed diverticula were                            found in the sigmoid colon and descending colon.                            Petechia were visualized in association with the  diverticular opening.                           Internal hemorrhoids were found during                            retroflexion. The hemorrhoids were small. Complications:            No immediate complications. Estimated Blood Loss:     Estimated blood loss was minimal. Impression:               - One 4 mm polyp in the descending colon, removed                            with a cold snare. Resected and retrieved.                           - Mild diverticulosis in the sigmoid colon and in                            the descending colon. Petechia were visualized in                            association with the diverticular opening.                           - Internal hemorrhoids. Recommendation:           - Patient has a contact number available for                            emergencies. The signs and symptoms of potential                             delayed complications were discussed with the                            patient. Return to normal activities tomorrow.                            Written discharge instructions were provided to the                            patient.                           - Resume previous diet.                           - Continue present medications.                           - Await pathology results.                           - Repeat colonoscopy is recommended for  surveillance. The colonoscopy date will be                            determined after pathology results from today's                            exam become available for review. Jerene Bears, MD 07/22/2019 10:27:23 AM This report has been signed electronically.

## 2019-07-24 ENCOUNTER — Telehealth: Payer: Self-pay

## 2019-07-24 NOTE — Telephone Encounter (Signed)
  Follow up Call-  Call back number 07/22/2019 04/19/2018  Post procedure Call Back phone  # [phone]  Permission to leave phone message Yes Yes  Some recent data might be hidden     Patient questions:  Do you have a fever, pain , or abdominal swelling? No. Pain Score  0 *  Have you tolerated food without any problems? Yes.    Have you been able to return to your normal activities? Yes.    Do you have any questions about your discharge instructions: Diet   No. Medications  No. Follow up visit  No.  Do you have questions or concerns about your Care? No.  Actions: * If pain score is 4 or above: No action needed, pain <4. 1. Have you developed a fever since your procedure? no  2.   Have you had an respiratory symptoms (SOB or cough) since your procedure? no  3.   Have you tested positive for COVID 19 since your procedure no  4.   Have you had any family members/close contacts diagnosed with the COVID 19 since your procedure?  no   If yes to any of these questions please route to Joylene John, RN and Alphonsa Gin, Therapist, sports.

## 2019-07-26 ENCOUNTER — Encounter: Payer: Self-pay | Admitting: Internal Medicine

## 2019-10-31 DIAGNOSIS — K219 Gastro-esophageal reflux disease without esophagitis: Secondary | ICD-10-CM | POA: Diagnosis not present

## 2019-10-31 DIAGNOSIS — I10 Essential (primary) hypertension: Secondary | ICD-10-CM | POA: Diagnosis not present

## 2019-10-31 DIAGNOSIS — E7800 Pure hypercholesterolemia, unspecified: Secondary | ICD-10-CM | POA: Diagnosis not present

## 2019-10-31 DIAGNOSIS — E119 Type 2 diabetes mellitus without complications: Secondary | ICD-10-CM | POA: Diagnosis not present

## 2019-11-04 DIAGNOSIS — E119 Type 2 diabetes mellitus without complications: Secondary | ICD-10-CM | POA: Diagnosis not present

## 2019-11-04 DIAGNOSIS — E7800 Pure hypercholesterolemia, unspecified: Secondary | ICD-10-CM | POA: Diagnosis not present

## 2019-11-04 DIAGNOSIS — Z7984 Long term (current) use of oral hypoglycemic drugs: Secondary | ICD-10-CM | POA: Diagnosis not present

## 2020-02-21 DIAGNOSIS — E1165 Type 2 diabetes mellitus with hyperglycemia: Secondary | ICD-10-CM | POA: Diagnosis not present

## 2020-03-06 ENCOUNTER — Ambulatory Visit: Payer: BC Managed Care – PPO | Admitting: Orthopedic Surgery

## 2020-03-11 ENCOUNTER — Ambulatory Visit: Payer: Self-pay

## 2020-03-11 ENCOUNTER — Ambulatory Visit (INDEPENDENT_AMBULATORY_CARE_PROVIDER_SITE_OTHER): Payer: BC Managed Care – PPO | Admitting: Orthopedic Surgery

## 2020-03-11 DIAGNOSIS — G8929 Other chronic pain: Secondary | ICD-10-CM

## 2020-03-11 DIAGNOSIS — M79601 Pain in right arm: Secondary | ICD-10-CM

## 2020-03-11 DIAGNOSIS — M25511 Pain in right shoulder: Secondary | ICD-10-CM | POA: Diagnosis not present

## 2020-03-14 ENCOUNTER — Encounter: Payer: Self-pay | Admitting: Orthopedic Surgery

## 2020-03-14 NOTE — Progress Notes (Signed)
Office Visit Note   [name]           Date of Birth: 1961/01/27           MRN: [id number] Visit Date: 03/11/2020 Requested by: Shirline Frees, MD [address] [place],  [place] [address] PCP: Shirline Frees, MD  Subjective: Chief Complaint  Patient presents with  . Neck - Pain  . Right Shoulder - Pain    HPI: Steven Gates is a 59 y.o. male who presents to the office complaining of right shoulder pain.  Patient notes pain for the last year.  He cannot recall specific injury leading to onset of pain.  He is right-hand dominant.  He localizes pain to the diffuse shoulder and notes it is a "deep pain".  He has occasional radiation to the elbow when pain is severe.  Pain is worse with lifting, abduction, crossover.  Pain ranges from a 2-6 out of 10.  No history of shoulder surgery or MRI.  He has had 1 previous injection by Dr. Ninfa Linden.  Pain is waking him up at night.  He does note some neck pain but denies any radiculopathy.  He has occasional numbness/tingling in his 5 fingers.  No subjective weakness or stiffness of the shoulder.  He does note mechanical symptoms with grinding of the shoulder with range of motion.  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                ROS:  All systems reviewed are negative as they relate to the chief complaint within the history of present illness.  Patient denies fevers or chills.  Assessment & Plan: Visit Diagnoses:  1. Right arm pain   2. Chronic right shoulder pain     Plan: Patient is a 59 year old male who presents complaint of right shoulder pain.  He has had 1 year of pain duration.  Pain is primarily in the right shoulder with occasional radiation to the elbow.  He has no subjective weakness and no weakness on exam.  No stiffness.  He does note mechanical symptoms.  He has positive O'Brien's test on exam.  No left shoulder symptoms.  Impression is  SLAP tear.  Ordered MRI arthrogram right shoulder to further evaluate for SLAP tear.  Follow-up after scan to review results.  Follow-Up Instructions: No follow-ups on file.   Orders:  Orders Placed This Encounter  Procedures  . XR Shoulder Right  . XR Cervical Spine 2 or 3 views  . MR SHOULDER RIGHT W CONTRAST  . Arthrogram   No orders of the defined types were placed in this encounter.     Procedures: No procedures performed   Clinical Data: No additional findings.  Objective: Vital Signs: There were no vitals taken for this visit.  Physical Exam:  Constitutional: Patient appears well-developed HEENT:  Head: Normocephalic Eyes:EOM are normal Neck: Normal range of motion Cardiovascular: Normal rate Pulmonary/chest: Effort normal Neurologic: Patient is alert Skin: Skin is warm Psychiatric: Patient has normal mood and affect  Ortho Exam:  Right shoulder Exam 160 degrees forward flexion, 110 degrees abduction, 45 degrees external rotation No loss of ER relative to the other shoulder.  Good endpoint with ER No significant tenderness to palpation over the [facility] joint.  Mild to moderate tenderness to palpation over the bicipital groove Positive O'Brien's test. Good subscapularis, supraspinatus, and infraspinatus strength Negative Hawkins impingement 5/5 grip strength,  forearm pronation/supination, and bicep strength No tenderness to palpation throughout the axial cervical spine.  No pain with cervical spine range of motion  Specialty Comments:  No specialty comments available.  Imaging: No results found.   PMFS History: Patient Active Problem List   Diagnosis Date Noted  . Hypertension   . Hyperlipidemia   . GERD (gastroesophageal reflux disease)   . Diabetes mellitus without complication ([place])   . Cancer [facility])    Past Medical History:  Diagnosis Date  . Allergy    mild  . Arthritis    hands   . Cancer ([place])    skin cancer - spot removed from neck  .  Diabetes mellitus without complication ([place])   . GERD (gastroesophageal reflux disease)   . Hx of adenomatous colonic polyps    x 12  on 04-19-2018  . Hyperlipidemia   . Hypertension     Family History  Problem Relation Age of Onset  . Colon cancer Sister        in her 67s  . Colon polyps Sister   . Breast cancer Sister   . Esophageal cancer Sister   . Stomach cancer Neg Hx   . Rectal cancer Neg Hx     Past Surgical History:  Procedure Laterality Date  . APPENDECTOMY    . carpel tunnel Right   . CERVICAL SPINE SURGERY     C3-C4 - plate  . COLONOSCOPY     greater than 10 yrs  in [place]  . ELBOW SURGERY Bilateral    elbow surgery x 2 - bilateral ulnar nerve  . MULTIPLE TOOTH EXTRACTIONS    . POLYPECTOMY     Social History   Occupational History  . Not on file  Tobacco Use  . Smoking status: Former Smoker    Packs/day: 1.00    Years: 25.00    Pack years: 25.00    Types: Cigarettes    Quit date: 2014    Years since quitting: 7.4  . Smokeless tobacco: Never Used  Vaping Use  . Vaping Use: Never used  Substance and Sexual Activity  . Alcohol use: Never  . Drug use: Not Currently    Types: Marijuana    Comment: Last use March 22 2018  . Sexual activity: Not on file

## 2020-03-18 ENCOUNTER — Ambulatory Visit: Payer: BC Managed Care – PPO | Admitting: Orthopedic Surgery

## 2020-04-13 ENCOUNTER — Ambulatory Visit
Admission: RE | Admit: 2020-04-13 | Discharge: 2020-04-13 | Disposition: A | Discharge status: Home / Self Care | Payer: BC Managed Care – PPO | Source: Ambulatory Visit | Attending: Orthopedic Surgery | Admitting: Orthopedic Surgery

## 2020-04-13 ENCOUNTER — Other Ambulatory Visit: Payer: Self-pay

## 2020-04-13 DIAGNOSIS — M25511 Pain in right shoulder: Secondary | ICD-10-CM | POA: Diagnosis not present

## 2020-04-13 DIAGNOSIS — S43431A Superior glenoid labrum lesion of right shoulder, initial encounter: Secondary | ICD-10-CM | POA: Diagnosis not present

## 2020-04-13 DIAGNOSIS — M79601 Pain in right arm: Secondary | ICD-10-CM

## 2020-04-13 MED ORDER — IOPAMIDOL (ISOVUE-M 200) INJECTION 41%: 15.0000 mL | Freq: Once | INTRAMUSCULAR | Status: DC

## 2020-04-15 ENCOUNTER — Ambulatory Visit: Payer: Self-pay

## 2020-04-15 ENCOUNTER — Ambulatory Visit (INDEPENDENT_AMBULATORY_CARE_PROVIDER_SITE_OTHER): Payer: BC Managed Care – PPO | Admitting: Orthopedic Surgery

## 2020-04-15 DIAGNOSIS — M542 Cervicalgia: Secondary | ICD-10-CM | POA: Diagnosis not present

## 2020-04-15 DIAGNOSIS — M25512 Pain in left shoulder: Secondary | ICD-10-CM

## 2020-04-15 DIAGNOSIS — M792 Neuralgia and neuritis, unspecified: Secondary | ICD-10-CM | POA: Diagnosis not present

## 2020-04-19 ENCOUNTER — Encounter: Payer: Self-pay | Admitting: Orthopedic Surgery

## 2020-04-19 NOTE — Progress Notes (Signed)
Office Visit Note   [name]           Date of Birth: 06-30-1961           MRN: [id number] Visit Date: 04/15/2020 Requested by: Shirline Frees, MD [address] [place],  [place] [address] PCP: Shirline Frees, MD  Subjective: Chief Complaint  Patient presents with  . Follow-up    HPI: Steven Gates is a 59 y.o. male who presents to the office complaining of bilateral shoulder pain.  He presents for MRI right shoulder reviewed.  Right shoulder MRI revealed focal near full-thickness articular surface tear of the anterior supraspinatus tendon as well as a small tear of the posterior infraspinatus tendon insertion with biceps tendinosis with suspected interstitial tear.  He notes that since his last office visit, his left shoulder has started to bother him now and his left shoulder is actually causing him more pain and discomfort in his right shoulder.  He wakes up with pain at night.  He also notes neck pain with numbness and tingling in his right hand.  He cannot look to the right without significant pain.  Pain is worse with activity throughout his bilateral right arms.  He has a history of AC 3 C4 fusion by Dr. Ronnald Ramp.  .                ROS: All systems reviewed are negative as they relate to the chief complaint within the history of present illness.  Patient denies fevers or chills.  Assessment & Plan: Visit Diagnoses:  1. Left shoulder pain, unspecified chronicity   2. Radicular pain in left arm   3. Radicular pain in right arm   4. Pain of cervical spine     Plan: Patient is a 59 year old male who presents complaining of right shoulder and left shoulder pain.  Right shoulder had MRI scan with results as described above.  He now has left shoulder pain that is worse than his right shoulder.  He also describes neck pain and recently has been unable to look to the right without pain.  No weakness on exam but he does have decreased sensation throughout both  hands.  He has subluxing ulnar nerves that are felt on exam.  With his radicular arm pain bilaterally and his history of a C3-C4 fusion by Dr. Ronnald Ramp, concern for cervical disc pathology.  Ordered cervical spine MRI to evaluate bilateral radicular symptoms.  Follow-up after MRI to review results.  Patient agreed with plan.  Follow-Up Instructions: No follow-ups on file.   Orders:  Orders Placed This Encounter  Procedures  . XR Shoulder Left  . MR Cervical Spine w/o contrast   No orders of the defined types were placed in this encounter.     Procedures: No procedures performed   Clinical Data: No additional findings.  Objective: Vital Signs: There were no vitals taken for this visit.  Physical Exam:  Constitutional: Patient appears well-developed HEENT:  Head: Normocephalic Eyes:EOM are normal Neck: Normal range of motion Cardiovascular: Normal rate Pulmonary/chest: Effort normal Neurologic: Patient is alert Skin: Skin is warm Psychiatric: Patient has normal mood and affect  Ortho Exam: Ortho exam demonstrates tenderness to palpation throughout the axial cervical spine.  Pain when patient looks to the right side.  No significant pain when looking to the left, up, down.  Decreased sensation in the right and left hand in the median nerve distribution.  Positive Phalen's sign of the left  and right hands.  Pain elicited with passive range of motion of the right and left shoulders.  Pain worse in the right shoulder with resisted supraspinatus and infraspinatus resistance testing.  Tenderness over the bicipital groove bilaterally.  Subluxing ulnar nerve is felt bilaterally.  5/5 motor strength of the bilateral grip, finger abduction, pronation/supination, bicep, tricep.  Specialty Comments:  No specialty comments available.  Imaging: No results found.   PMFS History: Patient Active Problem List   Diagnosis Date Noted  . Hypertension   . Hyperlipidemia   . GERD  (gastroesophageal reflux disease)   . Diabetes mellitus without complication ([place])   . Cancer [facility])    Past Medical History:  Diagnosis Date  . Allergy    mild  . Arthritis    hands   . Cancer ([place])    skin cancer - spot removed from neck  . Diabetes mellitus without complication ([place])   . GERD (gastroesophageal reflux disease)   . Hx of adenomatous colonic polyps    x 12  on 04-19-2018  . Hyperlipidemia   . Hypertension     Family History  Problem Relation Age of Onset  . Colon cancer Sister        in her 53s  . Colon polyps Sister   . Breast cancer Sister   . Esophageal cancer Sister   . Stomach cancer Neg Hx   . Rectal cancer Neg Hx     Past Surgical History:  Procedure Laterality Date  . APPENDECTOMY    . carpel tunnel Right   . CERVICAL SPINE SURGERY     C3-C4 - plate  . COLONOSCOPY     greater than 10 yrs  in [place]  . ELBOW SURGERY Bilateral    elbow surgery x 2 - bilateral ulnar nerve  . MULTIPLE TOOTH EXTRACTIONS    . POLYPECTOMY     Social History   Occupational History  . Not on file  Tobacco Use  . Smoking status: Former Smoker    Packs/day: 1.00    Years: 25.00    Pack years: 25.00    Types: Cigarettes    Quit date: 2014    Years since quitting: 7.5  . Smokeless tobacco: Never Used  Vaping Use  . Vaping Use: Never used  Substance and Sexual Activity  . Alcohol use: Never  . Drug use: Not Currently    Types: Marijuana    Comment: Last use March 22 2018  . Sexual activity: Not on file

## 2020-05-08 ENCOUNTER — Other Ambulatory Visit: Payer: BC Managed Care – PPO

## 2020-05-20 ENCOUNTER — Ambulatory Visit: Payer: BC Managed Care – PPO | Admitting: Orthopedic Surgery

## 2021-01-15 DIAGNOSIS — M25551 Pain in right hip: Secondary | ICD-10-CM | POA: Diagnosis not present

## 2021-01-15 DIAGNOSIS — M25561 Pain in right knee: Secondary | ICD-10-CM | POA: Diagnosis not present

## 2021-01-15 DIAGNOSIS — F439 Reaction to severe stress, unspecified: Secondary | ICD-10-CM | POA: Diagnosis not present

## 2021-01-15 DIAGNOSIS — M545 Low back pain, unspecified: Secondary | ICD-10-CM | POA: Diagnosis not present

## 2021-01-27 ENCOUNTER — Ambulatory Visit: Payer: BC Managed Care – PPO | Admitting: Orthopedic Surgery

## 2021-02-10 DIAGNOSIS — E7800 Pure hypercholesterolemia, unspecified: Secondary | ICD-10-CM | POA: Diagnosis not present

## 2021-02-10 DIAGNOSIS — E119 Type 2 diabetes mellitus without complications: Secondary | ICD-10-CM | POA: Diagnosis not present

## 2021-02-10 DIAGNOSIS — M159 Polyosteoarthritis, unspecified: Secondary | ICD-10-CM | POA: Diagnosis not present

## 2021-02-10 DIAGNOSIS — I10 Essential (primary) hypertension: Secondary | ICD-10-CM | POA: Diagnosis not present

## 2021-02-19 DIAGNOSIS — E7800 Pure hypercholesterolemia, unspecified: Secondary | ICD-10-CM | POA: Diagnosis not present

## 2021-02-19 DIAGNOSIS — I10 Essential (primary) hypertension: Secondary | ICD-10-CM | POA: Diagnosis not present

## 2021-02-19 DIAGNOSIS — Z125 Encounter for screening for malignant neoplasm of prostate: Secondary | ICD-10-CM | POA: Diagnosis not present

## 2021-02-19 DIAGNOSIS — E119 Type 2 diabetes mellitus without complications: Secondary | ICD-10-CM | POA: Diagnosis not present

## 2021-08-12 IMAGING — MR MR SHOULDER*R* W/CM
6 series · 40 of 40 positions shown · IV contrast (agent unspecified)
Comparison: X-ray 03/11/2020

CLINICAL DATA: Right shoulder pain and right hand numbness for 8
weeks. No known injury.

EXAM:
MR ARTHROGRAM OF THE RIGHT SHOULDER
TECHNIQUE: Multiplanar, multisequence MR imaging of the right shoulder was
performed following the administration of intra-articular contrast.
CONTRAST:  See Injection Documentation.

[Series 3: T1 fat-sat · axial · 4.0mm · 0.27mm/px · z∈[-25,+58]mm · 8 of 18 slices shown (1 of 4)]
[im 1/18]
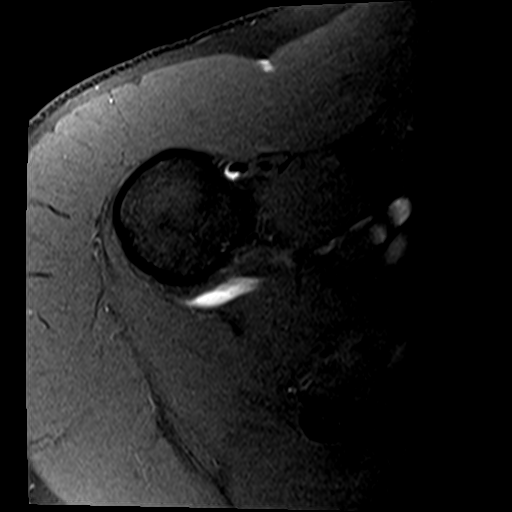
[im 3/18]
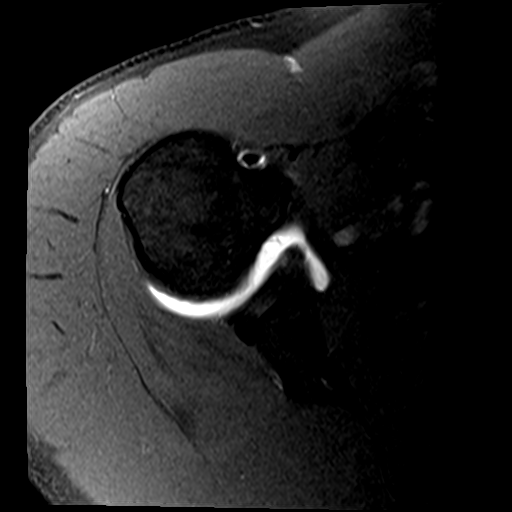
[im 5/18]
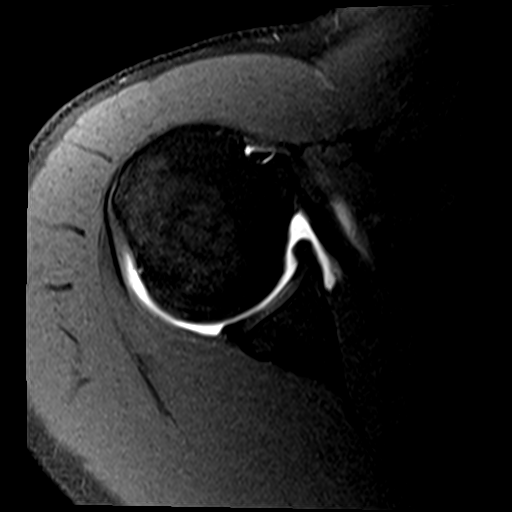
[im 8/18]
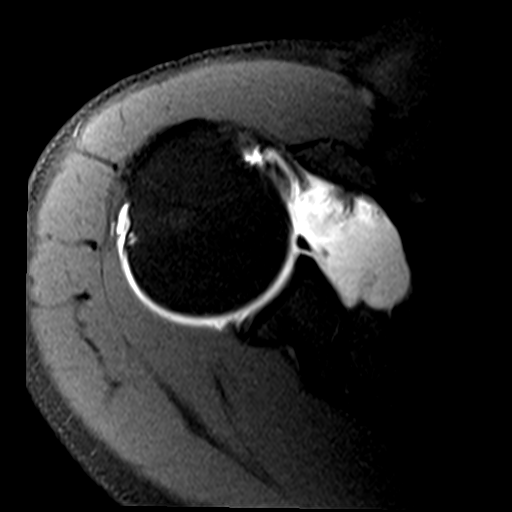
[im 10/18]
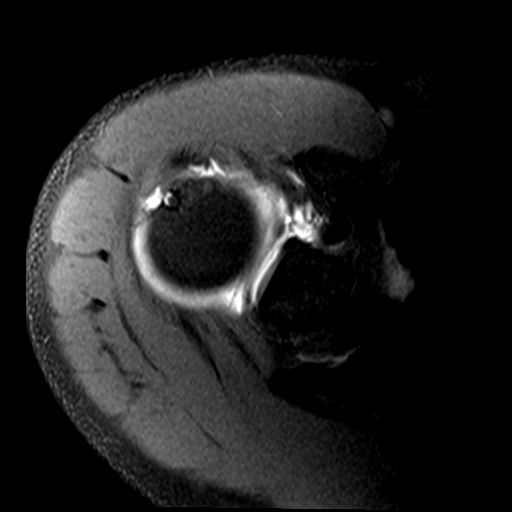
[im 13/18]
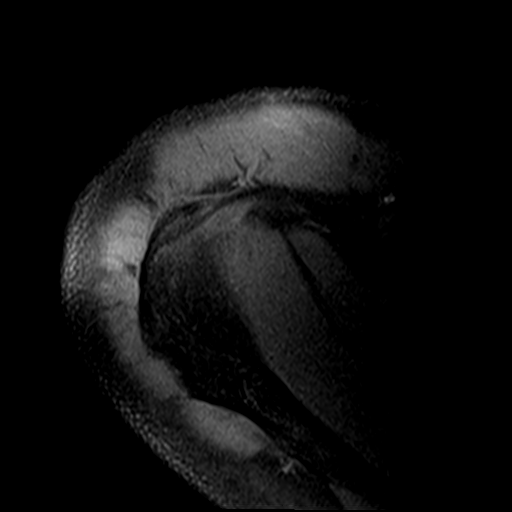
[im 15/18]
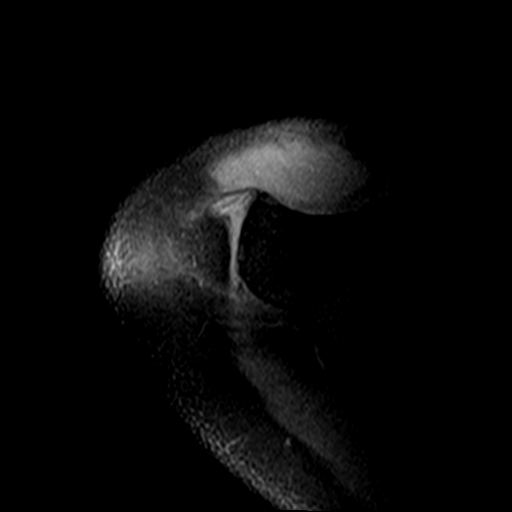
[im 18/18]
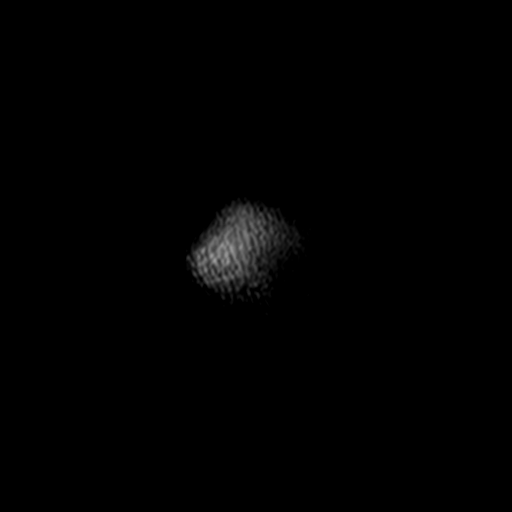

[Series 5: T1 fat-sat · sagittal · 4.0mm · 0.55mm/px · 6 of 16 slices shown (2 of 4)]
[im 1/16]
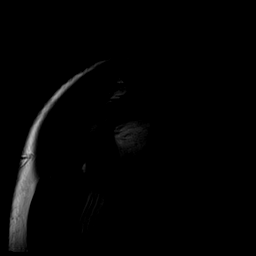
[im 4/16]
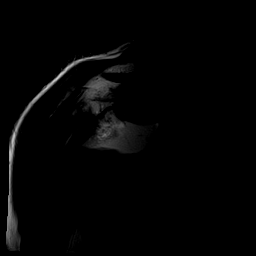
[im 7/16]
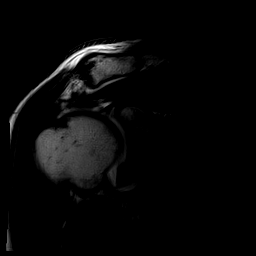
[im 10/16]
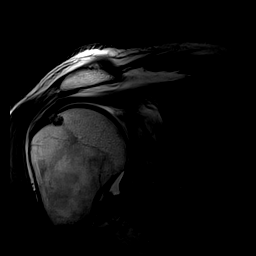
[im 13/16]
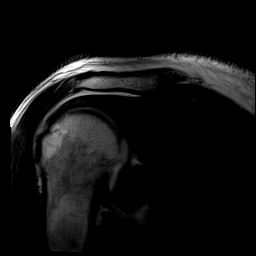
[im 16/16]
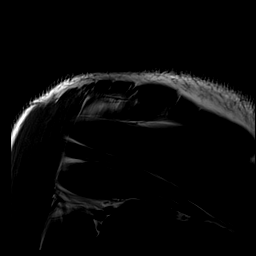

[Series 6: T1 fat-sat · sagittal · 4.0mm · 0.55mm/px · 6 of 16 slices shown (3 of 4)]
[im 1/16]
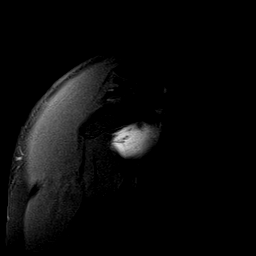
[im 4/16]
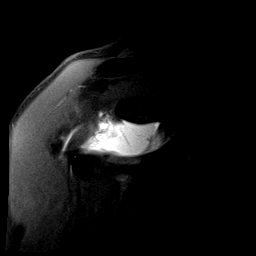
[im 7/16]
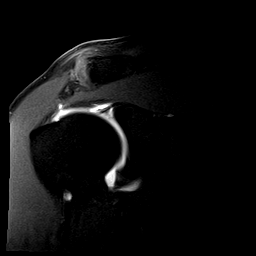
[im 10/16]
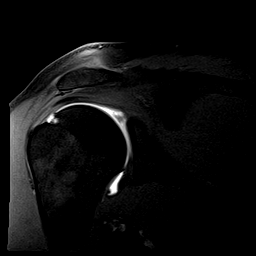
[im 13/16]
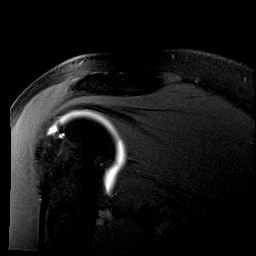
[im 16/16]
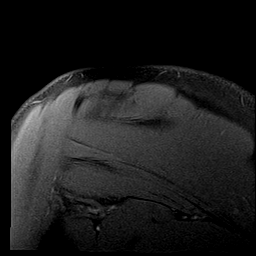

[Series 7: T2 fat-sat · sagittal · 4.0mm · 0.55mm/px · 6 of 16 slices shown (1 of 2)]
[im 1/16]
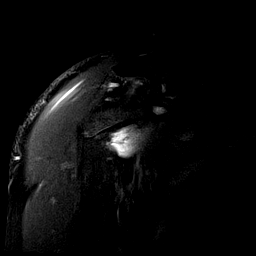
[im 4/16]
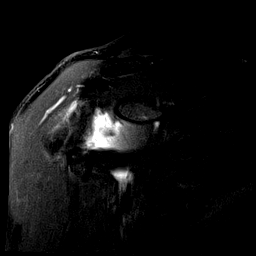
[im 7/16]
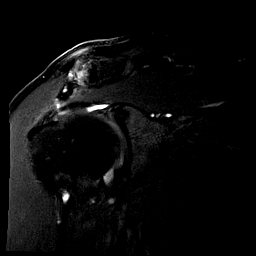
[im 10/16]
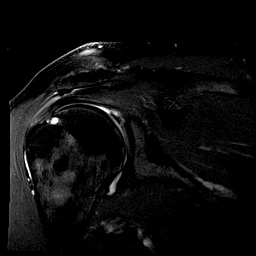
[im 13/16]
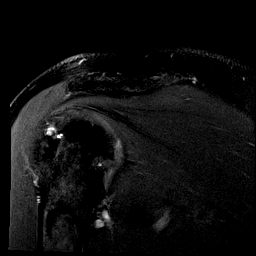
[im 16/16]
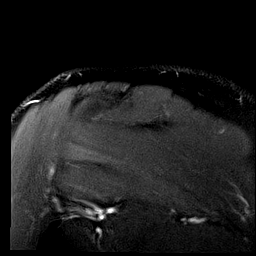

[Series 8: T2 fat-sat · coronal · 4.0mm · 0.55mm/px · 8 of 20 slices shown (2 of 2)]
[im 1/20]
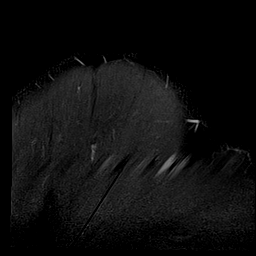
[im 3/20]
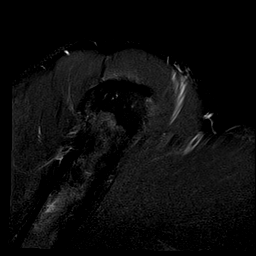
[im 6/20]
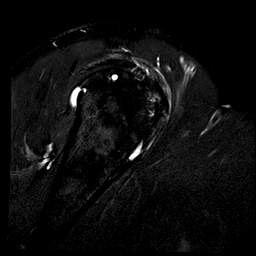
[im 9/20]
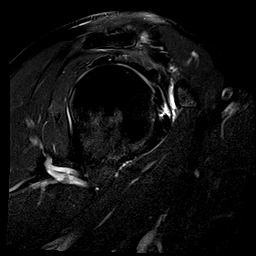
[im 11/20]
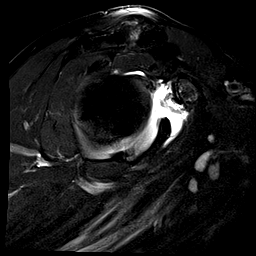
[im 14/20]
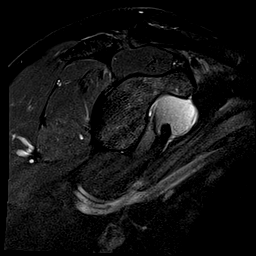
[im 17/20]
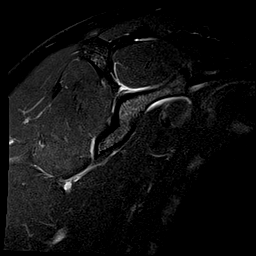
[im 20/20]
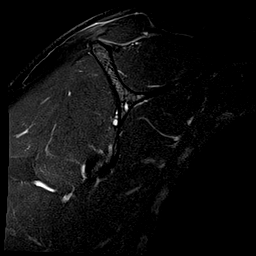

[Series 11: T1 fat-sat · oblique · 4.0mm · 0.59mm/px · 6 of 16 slices shown (4 of 4)]
[im 1/16]
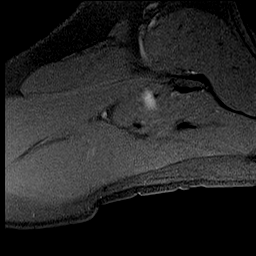
[im 4/16]
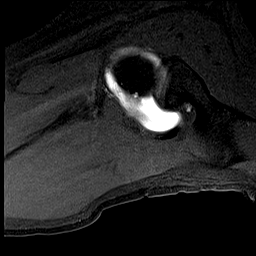
[im 7/16]
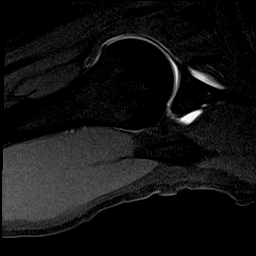
[im 10/16]
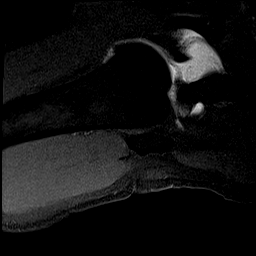
[im 13/16]
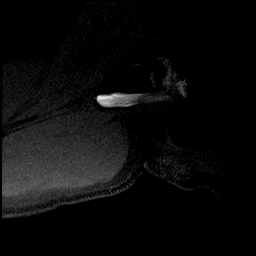
[im 16/16]
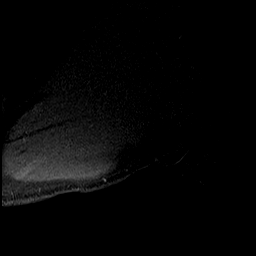

[40 of 40 positions shown; findings below may reference images not displayed]

FINDINGS: Rotator cuff: Focal near full-thickness articular surface tear of
the anterior supraspinatus tendon 1.0 cm proximal to its insertion
(series 6, images 10-11). Small focal rim rent tear of the posterior
infraspinatus tendon insertion (series 6, image 4; series 8, image
16). Mild subscapularis tendinosis without discrete tear. Intact
teres minor.

Muscles: Preserved bulk and signal intensity of the rotator cuff
musculature without edema, atrophy, or fatty infiltration.

Biceps long head: Intra-articular biceps tendinosis with suspected
interstitial tear (series 8, image 13).

Acromioclavicular Joint: Mild arthropathy of the AC joint. There is
a small amount of fluid within the subacromial-subdeltoid bursa. No
injected contrast extends into the bursal space.

Glenohumeral Joint: Well distended with injected contrast. No
intra-articular loose body. No cartilage defect.

Labrum: Irregular tear of the posterosuperior labrum (series 6,
images 7-8).

Bones: No acute fracture. No dislocation. Small subcortical cysts at
the greater tuberosity. No suspicious bone lesion.
IMPRESSION: 1. Focal near full-thickness articular surface tear of the anterior
supraspinatus tendon 1.0 cm proximal to its insertion.
2. Small focal rim rent tear of the posterior infraspinatus tendon
insertion.
3. Intra-articular biceps tendinosis with suspected interstitial
tear.
4. Irregular tear of the posterosuperior labrum.
5. Mild subacromial-subdeltoid bursitis.

## 2021-08-12 IMAGING — XA DG FLUORO GUIDE NDL PLC/BX
2 series · 2 of 2 positions shown · IV contrast (multihance)
Comparison: none

CLINICAL DATA: Right shoulder and arm pain.

EXAM:
RIGHT SHOULDER INJECTION UNDER FLUOROSCOPY
TECHNIQUE: An appropriate skin entrance site was determined. The site was
marked, prepped with Betadine, draped in the usual sterile fashion,
and infiltrated locally with 1% lidocaine. A 22 gauge spinal needle
was advanced to the superomedial margin of the humeral head under
intermittent fluoroscopy. 1 mL of 1% lidocaine injected easily. A
mixture of 0.1 mL of MultiHance, 15 mL of Isovue-M 200, and 5 mL of
sterile saline was then used to opacify the right shoulder capsule.
12 mL of this mixture were injected. No immediate complication.
FLUOROSCOPY TIME:  Fluoroscopy Time:  5 seconds
Radiation Exposure Index (if provided by the fluoroscopic device):
2.40 microGray*m^2
Number of Acquired Spot Images: 0

[Series 1: ortho adipose · 1 of 1 slices shown (1 of 2)]
[im 1/1]
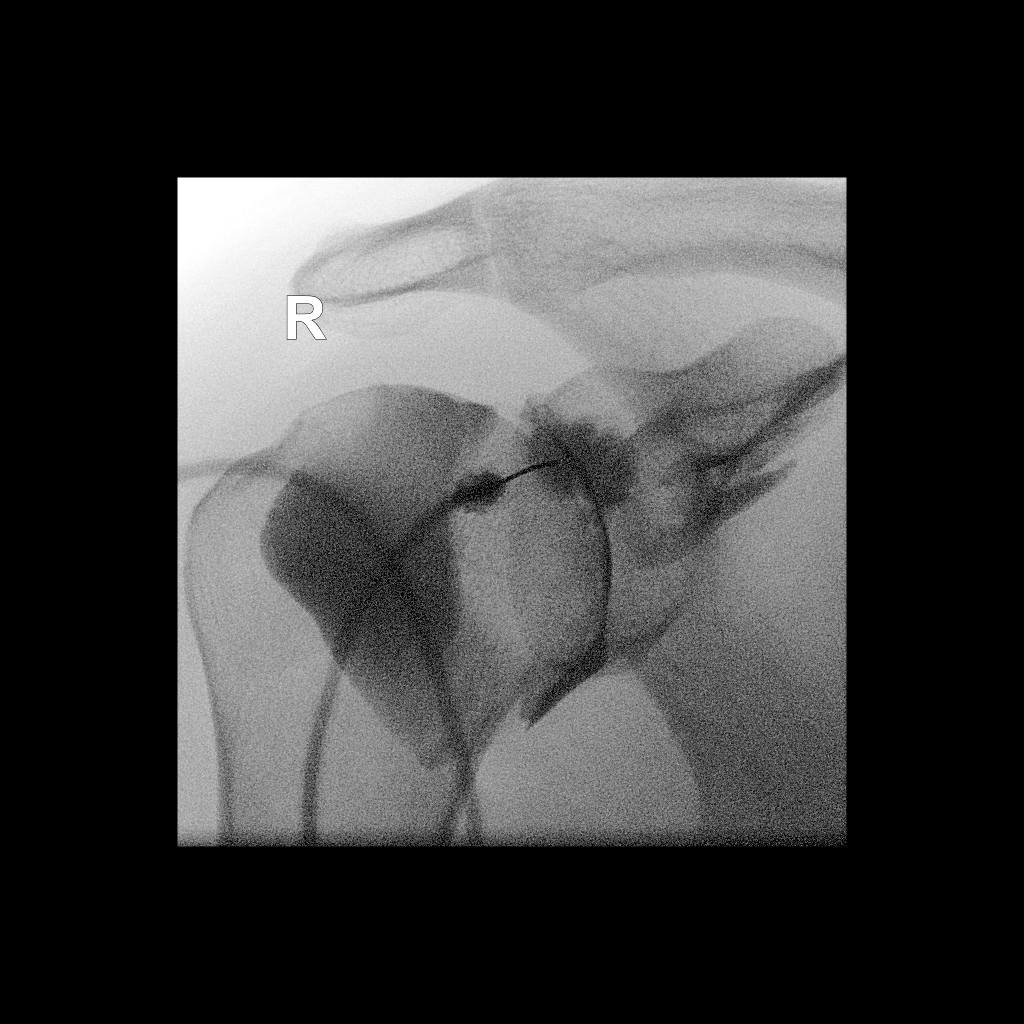

[Series 2: ortho adipose · 1 of 1 slices shown (2 of 2)]
[im 1/1]
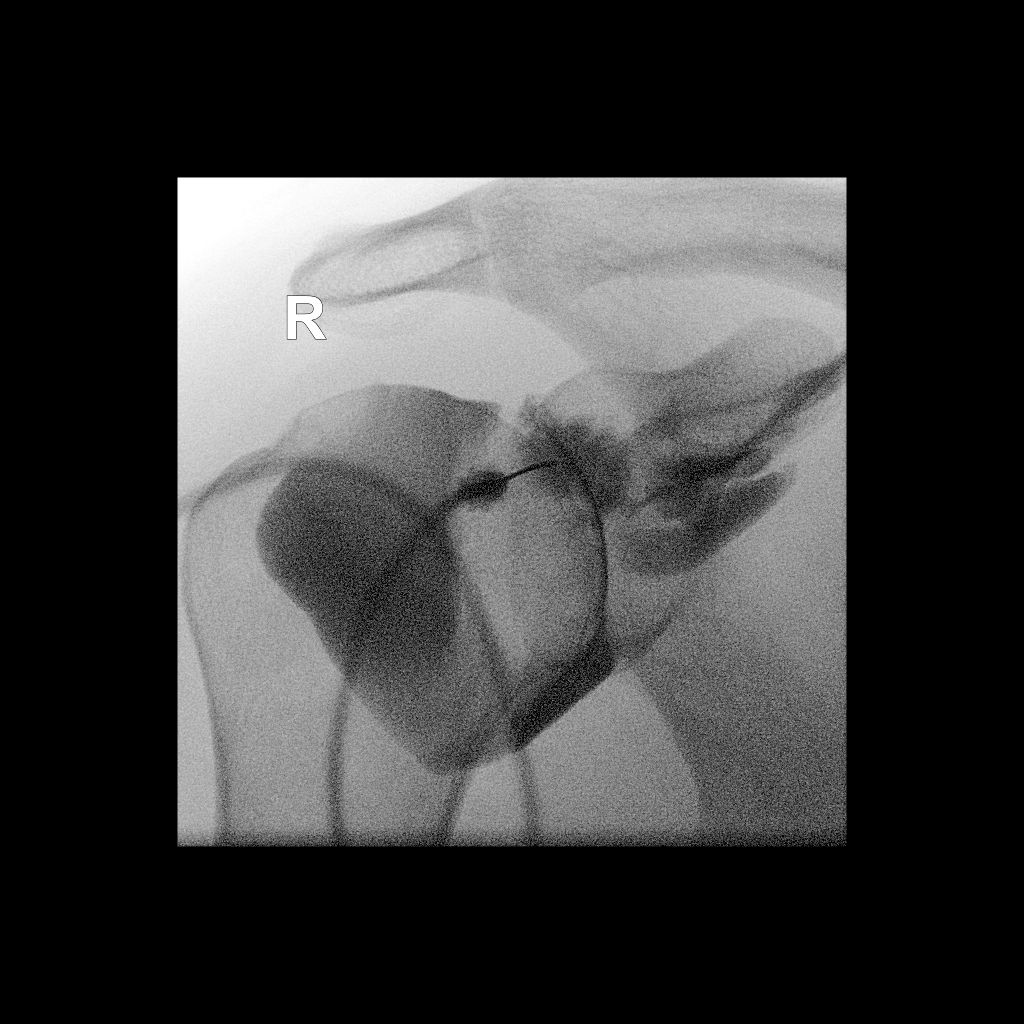

[2 of 2 positions shown; findings below may reference images not displayed]

IMPRESSION: Technically successful right shoulder injection for MRI.

## 2022-01-21 ENCOUNTER — Ambulatory Visit: Payer: Self-pay

## 2022-01-21 ENCOUNTER — Encounter: Admitting: Orthopedic Surgery

## 2022-01-21 ENCOUNTER — Encounter: Payer: Self-pay | Admitting: Orthopedic Surgery

## 2022-01-21 ENCOUNTER — Ambulatory Visit: Admitting: Orthopedic Surgery

## 2022-01-21 VITALS — Ht 65.0 in | Wt 167.0 lb

## 2022-01-21 DIAGNOSIS — R2231 Localized swelling, mass and lump, right upper limb: Secondary | ICD-10-CM

## 2022-01-21 DIAGNOSIS — M79644 Pain in right finger(s): Secondary | ICD-10-CM | POA: Diagnosis not present

## 2022-01-21 NOTE — Progress Notes (Signed)
? ?Office Visit Note ?  ?[name]           ?Date of Birth: 1961-08-27           ?MRN: [id number] ?Visit Date: 01/21/2022 ?             ?Requested by: Shirline Frees, MD ?[address] ?[address] ?[place],   [id number] ?PCP: Shirline Frees, MD ? ? ?Assessment & Plan: ?Visit Diagnoses:  ?1. Pain in right finger(s)   ?2. Finger mass, right   ? ? 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  I would like to try to get a copy of the actual imaging to review.  The mass is been present for 15 years but is becoming increasingly bothersome and has enlarged in size.  We discussed treatment options including continued observation versus surgical excision.  Patient is leaning towards surgical excision given the increasing pain and discomfort associated with the lesion.  We will discuss further once I am able to review the actual MRI imaging. ? ? ? ?Follow-Up Instructions: No follow-ups on file.  ? ?Orders:  ?Orders Placed This Encounter  ?Procedures  ? XR Finger Ring Right  ? ?No orders of the defined types were placed in this encounter. ? ? ? ? Procedures: ?No procedures performed ? ? ?Clinical Data: ?No additional findings. ? ? ?Subjective: ?Chief Complaint  ?Patient presents with  ? Right Hand - Pain  ?  Pain:8-9/10, RIGHT Handed, CTR x 10 years ago. Oset x several years, +numbness, wakes him up at night, MRI done show something on 3 sides of his finger  ? ? ?This is a 61 year old right-hand-dominant male who presents with a mass at the right ring finger.  He noticed a mass around 15 or more years ago.  It is localized to the area of the PIP joint of the ring finger.  It has enlarged in size since it was first noticed.  Was previously asymptomatic but is now becoming more bothersome for him.  He now wakes up several nights a week with aching pain around the  lesion.  He has some pain with range of motion of the finger.  He recent underwent MRI of the right hand at an outside hospital.  He is interested in discussing surgical management of the lesion. ? ? ?Review of Systems ? ? ?Objective: ?Vital Signs: Ht '5\' 5"'$  (1.651 m)   Wt 167 lb (75.8 kg)   BMI 27.79 kg/m?  ? ?Physical Exam ?Constitutional:   ?   Appearance: Normal appearance.  ?Cardiovascular:  ?   Rate and Rhythm: Normal rate.  ?   Pulses: Normal pulses.  ?Pulmonary:  ?   Effort: Pulmonary effort is normal.  ?Skin: ?   General: Skin is warm and dry.  ?   Capillary Refill: Capillary refill takes less than 2 seconds.  ?Neurological:  ?   Mental Status: He is alert.  ? ? ?Right Hand Exam  ? ?Tenderness  ?Right hand tenderness location: TTP at radial and ulnar borders of ring finger around PIP joint at area of mass. ? ?Other  ?Erythema: absent ?Sensation: normal ?Pulse: present ? ?Comments:  Questionable Tinel sign at radial and ulnar borders of finger.  Flexion contracture at ring finger PIP joint w/ ROM from 25-80 deg. Focal swelling at radial and ulnar borders of ring finger at  level of proximal phalanx.  ? ? ? ? ?Specialty Comments:  ?No specialty comments available. ? ?Imaging: ?No results found. ? ? ?PMFS History: ?Patient Active Problem List  ? Diagnosis Date Noted  ? Finger mass, right 01/21/2022  ? Hypertension   ? Hyperlipidemia   ? GERD (gastroesophageal reflux disease)   ? Diabetes mellitus without complication ([place])   ? Cancer [facility])   ? ?Past Medical History:  ?Diagnosis Date  ? Allergy   ? mild  ? Arthritis   ? hands   ? Cancer [facility])   ? skin cancer - spot removed from neck  ? Diabetes mellitus without complication ([place])   ? GERD (gastroesophageal reflux disease)   ? Hx of adenomatous colonic polyps   ? x 12  on 04-19-2018  ? Hyperlipidemia   ? Hypertension   ?  ?Family History  ?Problem Relation Age of Onset  ? Colon cancer Sister   ?     in her 67s  ? Colon polyps Sister   ? Breast cancer Sister   ?  Esophageal cancer Sister   ? Stomach cancer Neg Hx   ? Rectal cancer Neg Hx   ?  ?Past Surgical History:  ?Procedure Laterality Date  ? APPENDECTOMY    ? carpel tunnel Right   ? CERVICAL SPINE SURGERY    ? C3-C4 - plate  ? COLONOSCOPY    ? greater than 10 yrs  in [place]  ? ELBOW SURGERY Bilateral   ? elbow surgery x 2 - bilateral ulnar nerve  ? MULTIPLE TOOTH EXTRACTIONS    ? POLYPECTOMY    ? ?Social History  ? ?Occupational History  ? Not on file  ?Tobacco Use  ? Smoking status: Former  ?  Packs/day: 1.00  ?  Years: 25.00  ?  Pack years: 25.00  ?  Types: Cigarettes  ?  Quit date: 2014  ?  Years since quitting: 9.3  ? Smokeless tobacco: Never  ?Vaping Use  ? Vaping Use: Never used  ?Substance and Sexual Activity  ? Alcohol use: Never  ? Drug use: Not Currently  ?  Types: Marijuana  ?  Comment: Last use March 22 2018  ? Sexual activity: Not on file  ? ? ? ? ? ? ?

## 2022-02-04 ENCOUNTER — Telehealth: Payer: Self-pay | Admitting: Orthopedic Surgery

## 2022-02-04 NOTE — Telephone Encounter (Signed)
Pt called stating Dr Tempie Donning told him he would call pt and go over MRI results when they came in. Please call pt sometime today with MRI results. Pt phone number is [phone]

## 2022-02-04 NOTE — Telephone Encounter (Signed)
Have you reviewed the images yet?

## 2022-02-11 ENCOUNTER — Encounter: Payer: Self-pay | Admitting: Orthopedic Surgery

## 2022-02-11 ENCOUNTER — Ambulatory Visit (INDEPENDENT_AMBULATORY_CARE_PROVIDER_SITE_OTHER): Admitting: Orthopedic Surgery

## 2022-02-11 DIAGNOSIS — R2231 Localized swelling, mass and lump, right upper limb: Secondary | ICD-10-CM

## 2022-02-24 NOTE — Progress Notes (Signed)
Patient presents today to review the MRI.  Unfortunately, I'm unable to find the previous MRI again.  We will reach out to [facility] to have a copy of the MRI sent to [place].    We again discussed surgical excision of this mass which would involve a large incision over the finger.  The mass is situated between the flexors and the phalanx. We discussed the risk of surgery which include bleeding, infection, damage to neurovascular structures, mass recurrence, need for additional surgeries.  He would like to proceed.  A surgical date and time will be confirmed with the patient once I'm able to review the actual MRI imaging.

## 2022-10-04 ENCOUNTER — Encounter: Payer: Self-pay | Admitting: Internal Medicine

## 2022-10-17 ENCOUNTER — Encounter: Payer: Self-pay | Admitting: Internal Medicine

## 2022-11-18 ENCOUNTER — Encounter: Payer: Self-pay | Admitting: *Deleted

## 2022-12-20 ENCOUNTER — Ambulatory Visit: Admitting: Internal Medicine

## 2022-12-20 ENCOUNTER — Encounter: Payer: Self-pay | Admitting: Internal Medicine

## 2022-12-20 ENCOUNTER — Other Ambulatory Visit

## 2022-12-20 VITALS — BP 116/80 | HR 72 | Ht 64.75 in | Wt 161.2 lb

## 2022-12-20 DIAGNOSIS — Z86010 Personal history of colonic polyps: Secondary | ICD-10-CM

## 2022-12-20 DIAGNOSIS — D509 Iron deficiency anemia, unspecified: Secondary | ICD-10-CM | POA: Diagnosis not present

## 2022-12-20 DIAGNOSIS — K219 Gastro-esophageal reflux disease without esophagitis: Secondary | ICD-10-CM | POA: Diagnosis not present

## 2022-12-20 MED ORDER — MOVIPREP 100 G PO SOLR: 1.0000 | ORAL | 0 refills | Status: DC

## 2022-12-20 NOTE — Progress Notes (Signed)
Patient ID: Steven Gates, male   DOB: Oct 30, 1960, 62 y.o.   MRN: [id number] HPI: Steven Gates is a 62 year old male known to me from history of multiple adenomatous colon polyps who also has a history of GERD, hypertension hyperlipidemia and arthritis who is seen in consult at the request of Dr. Kenton Kingfisher for iron deficiency anemia.  He is here today with his wife.  Unbeknownst to him he developed iron deficiency anemia discovered on annual physical.  He did not have fatigue.  He states he did not see blood in his stool or melena.  No blood in urine.  No easy bruising.  He does take over-the-counter Nexium 20 mg once daily for longstanding heartburn and indigestion.  He will occasionally have breakthrough heartburn but he denies dysphagia and a dyne aphasia.  He reports his bowel habits are regular though stools can be occasionally loose with his metformin.  He denies early satiety, nausea, vomiting and weight loss.  No known family history of celiac disease.  He did receive IV iron, Venofer x 2 and has been on oral iron once daily.  Past Medical History:  Diagnosis Date   Allergy    mild   Arthritis    hands    Cancer    skin cancer - spot removed from neck   Diabetes mellitus without complication    Diverticulosis    GERD (gastroesophageal reflux disease)    Hx of adenomatous colonic polyps    x 12  on 04-19-2018   Hyperlipidemia    Hypertension    IDA (iron deficiency anemia)    Internal hemorrhoids     Past Surgical History:  Procedure Laterality Date   APPENDECTOMY     carpel tunnel Right    CERVICAL SPINE SURGERY     C3-C4 - plate   COLONOSCOPY     greater than 10 yrs  in [place] Bilateral    elbow surgery x 2 - bilateral ulnar nerve   MULTIPLE TOOTH EXTRACTIONS     POLYPECTOMY      Outpatient Medications Prior to Visit  Medication Sig Dispense Refill   aspirin EC 81 MG tablet Take 81 mg by mouth daily.     esomeprazole (NEXIUM) 20 MG capsule Take 20  mg by mouth daily at 12 noon.     ferrous gluconate (FERGON) 324 MG tablet Take 324 mg by mouth daily.     hydrochlorothiazide (HYDRODIURIL) 25 MG tablet Take 25 mg by mouth daily.     irbesartan (AVAPRO) 150 MG tablet Take 150 mg by mouth daily.     metFORMIN (GLUCOPHAGE) 500 MG tablet Take by mouth every morning.     metoprolol succinate (TOPROL-XL) 100 MG 24 hr tablet Take 100 mg by mouth daily. Take with or immediately following a meal.     simvastatin (ZOCOR) 40 MG tablet Take 40 mg by mouth daily.     naproxen (NAPROSYN) 500 MG tablet Take 500 mg by mouth 2 (two) times daily.     No facility-administered medications prior to visit.    Allergies  Allergen Reactions   Prednisone Other (See Comments)    Irritable    Family History  Problem Relation Age of Onset   Hypertension Mother    Alcohol abuse Mother    Heart disease Mother    Hypertension Father    Alcohol abuse Father    Colon cancer Sister        in her 59s   Colon  polyps Sister    Breast cancer Sister    Esophageal cancer Sister    Hypertension Sister    Heart disease Sister    Peripheral vascular disease Sister    Lung cancer Sister    Heart disease Sister    Coronary artery disease Sister    COPD Sister    Peptic Ulcer Sister    Anemia Sister    Skin cancer Brother    Heart disease Brother    Diabetes Brother    Stroke Brother    Hypertension Brother    Hyperlipidemia Brother    Cancer Brother        type unknown   Heart disease Brother    Hypertension Brother    Hyperlipidemia Brother    Diabetes Brother    Stroke Brother    Hypertension Brother    Hyperlipidemia Brother    Diabetes Brother    Stomach cancer Neg Hx    Rectal cancer Neg Hx     Social History   Tobacco Use   Smoking status: Former    Packs/day: 1.00    Years: 25.00    Additional pack years: 0.00    Total pack years: 25.00    Types: Cigarettes    Quit date: 2014    Years since quitting: 10.2   Smokeless tobacco: Never   Vaping Use   Vaping Use: Never used  Substance Use Topics   Alcohol use: Never   Drug use: Not Currently    Types: Marijuana    Comment: Last use March 22 2018    ROS: As per history of present illness, otherwise negative  BP 116/80 (BP Location: Left Arm, Patient Position: Sitting, Cuff Size: Normal)   Pulse 72   Ht 5' 4.75" (1.645 m) Comment: height measured without shoes  Wt 161 lb 4 oz (73.1 kg)   BMI 27.04 kg/m  Gen: awake, alert, NAD HEENT: anicteric  CV: RRR, no mrg Pulm: CTA b/l Abd: soft, NT/ND, +BS throughout Ext: no c/c/e Neuro: nonfocal   RELEVANT LABS AND IMAGING: Labs reviewed from [facility] by care everywhere In March 2023 ferritin was 18 with iron saturation 6 and TIBC elevated at 472 In January 2024 ferritin was 165 with normal iron saturation and TIBC Hemoglobin was 10.4 in March 2023 and 14.6 in January 2024 B12 normal at 352 in March 2023  ASSESSMENT/PLAN:  62 year old male known to me from history of multiple adenomatous colon polyps who also has a history of GERD, hypertension hyperlipidemia and arthritis who is seen in consult at the request of Dr. Kenton Kingfisher for iron deficiency anemia.   IDA --no overt bleeding but definitively iron deficient.  Responded to IV iron and oral iron.  His blood counts and iron studies have normalized.  We reviewed this diagnosis and I recommended upper and lower endoscopy and celiac testing.  We reviewed the risk, benefits and alternatives to upper and lower endoscopy and he is agreeable and wishes to proceed -- EGD and colonoscopy in the [place] -- TTG and serum IgA -- Continue oral iron -- Primary care to monitor iron studies and blood counts routinely  2.  History of multiple adenomatous colon polyps --greater than 10 adenomas in 2019; only 1 adenoma at surveillance in November 2020.  He is due for surveillance at this time and we are proceeding as discussed and #1 -- Colonoscopy in the [place]  3.  GERD --longstanding.  Without  alarm symptoms.  Symptoms controlled on over-the-counter Nexium 20 mg daily --  For now he will continue over-the-counter Nexium 20 mg daily    [id number], [name], [name],  [place] [address]

## 2022-12-20 NOTE — Patient Instructions (Addendum)
_______________________________________________________  If your blood pressure at your visit was 140/90 or greater, please contact your primary care physician to follow up on this.  If you are age 62 or younger, your body mass index should be between 19-25. Your Body mass index is 27.04 kg/m. If this is out of the aformentioned range listed, please consider follow up with your Primary Care Provider.  ________________________________________________________  The [facility] providers would like to encourage you to use [facility] to communicate with providers for non-urgent requests or questions.  Due to long hold times on the telephone, sending your provider a message by [facility] may be a faster and more efficient way to get a response.  Please allow 48 business hours for a response.  Please remember that this is for non-urgent requests.  _______________________________________________________  Your provider has requested that you go to the basement level for lab work before leaving today. Press "B" on the elevator. The lab is located at the first door on the left as you exit the elevator.  You have been scheduled for an endoscopy and colonoscopy. Please follow the written instructions given to you at your visit today. Please pick up your prep supplies at the pharmacy within the next 1-3 days. If you use inhalers (even only as needed), please bring them with you on the day of your procedure.  Due to recent changes in healthcare laws, you may see the results of your imaging and laboratory studies on MyChart before your provider has had a chance to review them.  We understand that in some cases there may be results that are confusing or concerning to you. Not all laboratory results come back in the same time frame and the provider may be waiting for multiple results in order to interpret others.  Please give [place] 48 hours in order for your provider to thoroughly review all the results before contacting the  office for clarification of your results.   Thank you for entrusting me with your care and choosing [facility].  Dr Hilarie Fredrickson

## 2022-12-22 LAB — IGA: Immunoglobulin A: 103 mg/dL (ref 70–320)

## 2022-12-22 LAB — TISSUE TRANSGLUTAMINASE, IGA: (tTG) Ab, IgA: <1 U/mL

## 2023-01-05 ENCOUNTER — Encounter: Payer: Self-pay | Admitting: Orthopedic Surgery

## 2023-01-05 ENCOUNTER — Ambulatory Visit (INDEPENDENT_AMBULATORY_CARE_PROVIDER_SITE_OTHER)

## 2023-01-05 ENCOUNTER — Ambulatory Visit: Admitting: Orthopedic Surgery

## 2023-01-05 DIAGNOSIS — M25562 Pain in left knee: Secondary | ICD-10-CM

## 2023-01-05 DIAGNOSIS — M25552 Pain in left hip: Secondary | ICD-10-CM | POA: Diagnosis not present

## 2023-01-05 DIAGNOSIS — M659 Synovitis and tenosynovitis, unspecified: Secondary | ICD-10-CM | POA: Diagnosis not present

## 2023-01-05 MED ORDER — LIDOCAINE HCL 1 % IJ SOLN
5.0000 mL | INTRAMUSCULAR | Status: AC | PRN
  Administered 2023-01-05: 5 mL

## 2023-01-05 MED ORDER — BUPIVACAINE HCL 0.25 % IJ SOLN
4.0000 mL | INTRAMUSCULAR | Status: AC | PRN
  Administered 2023-01-05: 4 mL via INTRA_ARTICULAR

## 2023-01-05 MED ORDER — METHYLPREDNISOLONE ACETATE 40 MG/ML IJ SUSP
40.0000 mg | INTRAMUSCULAR | Status: AC | PRN
  Administered 2023-01-05: 40 mg via INTRA_ARTICULAR

## 2023-01-05 NOTE — Progress Notes (Signed)
Office Visit Note   [name]           Date of Birth: 02-Jun-1961           MRN: [id number] Visit Date: 01/05/2023 Requested by: Vivien Presto, MD [phone] [address] [place],  [place] [address] PCP: Vivien Presto, MD  Subjective: Chief Complaint  Patient presents with   Left Knee - Pain   Left Hip - Pain    HPI: Steven Gates is a 62 y.o. male who presents to the office reporting left hip and left knee pain for the past 4 to 5 months.  Denies any history of injury.  States that he has very focal left knee pain localizing to the lateral joint line.  Describes increased pain after sitting.  Pain comes and goes.  Denies much in the way of mechanical symptoms or swelling in the knee.  Does report occasional groin pain with most of his pain in the hip is in the posterior gluteal region.  No numbness and tingling in the leg or foot.  No prior hip or knee surgery.  Patient also reports right fourth finger mass.  This was explored last year and referred to Dr. Frazier Butt for further evaluation and management of this mass.  From review of the medical record Dr. Frazier Butt did not follow-up with Brynda Greathouse about this mass.  He denies any numbness and tingling in that right fourth finger but does report difficulty riding motorcycles.  Has a little bit of stiffness in that PIP joint as well.  He has had 2 MRI scans one was in 2015 and the other was done at a [facility] center and that was from 2023.              ROS: All systems reviewed are negative as they relate to the chief complaint within the history of present illness.  Patient denies fevers or chills.  Assessment & Plan: Visit Diagnoses:  1. Left knee pain, unspecified chronicity   2. Pain in left hip     Plan: Impression is left knee pain which may be focal meniscal pathology.  Injection performed in the knee with 6-week follow-up to decide for or against MRI scanning.  The hip does not appear to be arthritic.  Not having too  much in terms of back pain.  I think this is something we can wait on and reevaluate next clinic visit.  Does not really appear to be trochanteric bursitis but that is in the differential as well.  Hip flexion and abduction strength are excellent on the left and right-hand side.  Regarding the right fourth finger mass this is examined under ultrasound today.  It does appear that there are some areas of hyperechoic signal and this is also not a cystic mass.  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  Plan for referral to Dr. Mina Marble for definitive management of this complicated volar mass which is wrapping around the proximal phalanx. Follow-Up Instructions: No follow-ups on file.   Orders:  Orders Placed This Encounter  Procedures   XR KNEE 3 VIEW LEFT   XR HIP UNILAT W OR W/O PELVIS 2-3 VIEWS LEFT   No orders of the defined types were placed in this encounter.     Procedures: Large Joint Inj: L knee on 01/05/2023 2:03 PM Indications: diagnostic evaluation, joint swelling and pain Details: 18 G 1.5 in needle, superolateral approach  Arthrogram: No  Medications: 5 mL lidocaine 1 %; 40 mg methylPREDNISolone acetate 40 MG/ML; 4 mL bupivacaine 0.25 % Outcome: tolerated well, no immediate complications Procedure, treatment alternatives, risks and benefits explained, specific risks discussed. Consent was given by the patient. Immediately prior to procedure a time out was called to verify the correct patient, procedure, equipment, support staff and site/side marked as required. Patient was prepped and draped in the usual sterile fashion.       Clinical Data: No additional findings.  Objective: Vital Signs: There were no vitals taken for this visit.  Physical Exam:  Constitutional: Patient appears well-developed HEENT:  Head: Normocephalic Eyes:EOM are normal Neck: Normal range of motion Cardiovascular: Normal rate Pulmonary/chest: Effort  normal Neurologic: Patient is alert Skin: Skin is warm Psychiatric: Patient has normal mood and affect  Ortho Exam: Ortho exam demonstrates normal gait alignment.  No antalgic gait on the left or right.  Left knee has full active and passive range of motion with lateral joint line tenderness and positive McMurray compression testing.  Extensor mechanism intact and nontender.  No real tenderness over the iliotibial band interface with the lateral condyle.  No masses lymphadenopathy or skin changes noted in that left knee or hip region.  Left hip has no pain with no groin pain with internal/external rotation of the leg.  Abduction hip flexion and adduction strength 5+ out of 5 and symmetric bilaterally.  No nerve root tension signs.  Pedal pulses palpable.  Reflexes symmetric 0 1+ out of 4 bilateral patella and Achilles.  No paresthesias L1 S1 bilaterally.  Mild trochanteric tenderness to direct palpation left versus right.  Examination of the right fourth finger demonstrates about 20 degrees less flexion at the PIP joint on the right compared to the left.  Does have palpable and slightly tender dumbbell shaped mass wrapping around the volar aspect of the finger extending up the ulnar and radial side.  These masses are not really particularly mobile but appear to be fixed underlying tissue.  FDS and FDP tendons are functional and strong.  No paresthesias on either side of that right fourth finger.  Specialty Comments:  No specialty comments available.  Imaging: XR HIP UNILAT W OR W/O PELVIS 2-3 VIEWS LEFT  Result Date: 01/05/2023 AP pelvis lateral left hip reviewed.  No acute fracture.  Bony pelvis normal.  No arthritis or joint space narrowing in that left hip joint.  XR KNEE 3 VIEW LEFT  Result Date: 01/05/2023 AP lateral merchant radiographs left knee reviewed.  No acute fracture.  Minimal degenerative changes in the medial lateral patellofemoral compartment.  Alignment intact    PMFS  History: Patient Active Problem List   Diagnosis Date Noted   Finger mass, right 01/21/2022   Hypertension    Hyperlipidemia    GERD (gastroesophageal reflux disease)    Diabetes mellitus without complication    Cancer    Past Medical History:  Diagnosis Date   Allergy    mild   Arthritis    hands    Cancer    skin cancer - spot removed from neck   Diabetes mellitus without complication    Diverticulosis    GERD (gastroesophageal reflux disease)    Hx of adenomatous colonic polyps    x 12  on 04-19-2018   Hyperlipidemia    Hypertension    IDA (iron deficiency anemia)    Internal hemorrhoids     Family History  Problem Relation Age of Onset   Hypertension Mother  Alcohol abuse Mother    Heart disease Mother    Hypertension Father    Alcohol abuse Father    Colon cancer Sister        in her 69s   Colon polyps Sister    Breast cancer Sister    Esophageal cancer Sister    Hypertension Sister    Heart disease Sister    Peripheral vascular disease Sister    Lung cancer Sister    Heart disease Sister    Coronary artery disease Sister    COPD Sister    Peptic Ulcer Sister    Anemia Sister    Skin cancer Brother    Heart disease Brother    Diabetes Brother    Stroke Brother    Hypertension Brother    Hyperlipidemia Brother    Cancer Brother        type unknown   Heart disease Brother    Hypertension Brother    Hyperlipidemia Brother    Diabetes Brother    Stroke Brother    Hypertension Brother    Hyperlipidemia Brother    Diabetes Brother    Stomach cancer Neg Hx    Rectal cancer Neg Hx     Past Surgical History:  Procedure Laterality Date   APPENDECTOMY     carpel tunnel Right    CERVICAL SPINE SURGERY     C3-C4 - plate   COLONOSCOPY     greater than 10 yrs  in [place]   ELBOW SURGERY Bilateral    elbow surgery x 2 - bilateral ulnar nerve   MULTIPLE TOOTH EXTRACTIONS     POLYPECTOMY     Social History   Occupational History   Occupation:  retired  Tobacco Use   Smoking status: Former    Packs/day: 1.00    Years: 25.00    Additional pack years: 0.00    Total pack years: 25.00    Types: Cigarettes    Quit date: 2014    Years since quitting: 10.3   Smokeless tobacco: Never  Vaping Use   Vaping Use: Never used  Substance and Sexual Activity   Alcohol use: Never   Drug use: Not Currently    Types: Marijuana    Comment: Last use March 22 2018   Sexual activity: Not on file

## 2023-01-06 ENCOUNTER — Telehealth: Payer: Self-pay

## 2023-01-06 ENCOUNTER — Other Ambulatory Visit: Payer: Self-pay

## 2023-01-06 DIAGNOSIS — R2231 Localized swelling, mass and lump, right upper limb: Secondary | ICD-10-CM

## 2023-01-06 NOTE — Telephone Encounter (Signed)
-----   Message from Cammy Copa, MD sent at 01/05/2023 10:12 PM EDT ----- Cresenciano Lick can you refer him to Dr. Mina Marble for right volar finger ring mass and also have him follow-up with Franky Macho in 6 weeks.  Decision point then would be for or against MRI scanning of the left knee thanks

## 2023-01-06 NOTE — Telephone Encounter (Signed)
Referral placed. Please make sure 6 week follow up scheduled with Franky Macho.

## 2023-01-09 ENCOUNTER — Telehealth: Payer: Self-pay | Admitting: Surgical

## 2023-01-09 NOTE — Telephone Encounter (Signed)
Called patient left message to return call to r/s his appointment 6 weeks out with Franky Macho instead of Dr. August Saucer   around 02/16/2023

## 2023-02-16 ENCOUNTER — Ambulatory Visit: Admitting: Orthopedic Surgery

## 2023-02-17 ENCOUNTER — Ambulatory Visit: Admitting: Surgical

## 2023-02-18 HISTORY — PX: ESOPHAGOGASTRODUODENOSCOPY: SHX1529

## 2023-02-28 ENCOUNTER — Encounter: Payer: Self-pay | Admitting: Internal Medicine

## 2023-02-28 ENCOUNTER — Ambulatory Visit (AMBULATORY_SURGERY_CENTER): Admitting: Internal Medicine

## 2023-02-28 VITALS — BP 140/83 | HR 63 | Temp 98.6°F | Resp 13 | Ht 64.0 in | Wt 161.0 lb

## 2023-02-28 DIAGNOSIS — D509 Iron deficiency anemia, unspecified: Secondary | ICD-10-CM

## 2023-02-28 DIAGNOSIS — D122 Benign neoplasm of ascending colon: Secondary | ICD-10-CM

## 2023-02-28 DIAGNOSIS — K319 Disease of stomach and duodenum, unspecified: Secondary | ICD-10-CM

## 2023-02-28 DIAGNOSIS — D123 Benign neoplasm of transverse colon: Secondary | ICD-10-CM

## 2023-02-28 DIAGNOSIS — Z86010 Personal history of colonic polyps: Secondary | ICD-10-CM | POA: Diagnosis not present

## 2023-02-28 DIAGNOSIS — K2100 Gastro-esophageal reflux disease with esophagitis, without bleeding: Secondary | ICD-10-CM | POA: Diagnosis not present

## 2023-02-28 DIAGNOSIS — Z09 Encounter for follow-up examination after completed treatment for conditions other than malignant neoplasm: Secondary | ICD-10-CM | POA: Diagnosis present

## 2023-02-28 DIAGNOSIS — K209 Esophagitis, unspecified without bleeding: Secondary | ICD-10-CM

## 2023-02-28 DIAGNOSIS — K219 Gastro-esophageal reflux disease without esophagitis: Secondary | ICD-10-CM

## 2023-02-28 MED ORDER — ESOMEPRAZOLE MAGNESIUM 40 MG PO CPDR: 40.0000 mg | DELAYED_RELEASE_CAPSULE | Freq: Two times a day (BID) | ORAL | 3 refills | Status: DC

## 2023-02-28 MED ORDER — SODIUM CHLORIDE 0.9 % IV SOLN: 500.0000 mL | Freq: Once | INTRAVENOUS | Status: DC

## 2023-02-28 NOTE — Progress Notes (Signed)
GASTROENTEROLOGY PROCEDURE H&P NOTE   Primary Care Physician: Corrington, Meredith Mody, MD    Reason for Procedure:  IDA, history of colon polyps and GERD  Plan:    EGD and colonoscopy  Patient is appropriate for endoscopic procedure(s) in the ambulatory ([facility]) setting.  The nature of the procedure, as well as the risks, benefits, and alternatives were carefully and thoroughly reviewed with the patient. Ample time for discussion and questions allowed. The patient understood, was satisfied, and agreed to proceed.     HPI: Steven Gates is a 62 y.o. male who presents for EGD and colonoscopy.  Medical history as below.  Tolerated the prep.  No recent chest pain or shortness of breath.  No abdominal pain today.  Past Medical History:  Diagnosis Date   Allergy    mild   Arthritis    hands    Cancer (HCC)    skin cancer - spot removed from neck   Diabetes mellitus without complication (HCC)    Diverticulosis    GERD (gastroesophageal reflux disease)    Hx of adenomatous colonic polyps    x 12  on 04-19-2018   Hyperlipidemia    Hypertension    IDA (iron deficiency anemia)    Internal hemorrhoids     Past Surgical History:  Procedure Laterality Date   APPENDECTOMY     carpel tunnel Right    CERVICAL SPINE SURGERY     C3-C4 - plate   COLONOSCOPY     greater than 10 yrs  in [place]   ELBOW SURGERY Bilateral    elbow surgery x 2 - bilateral ulnar nerve   MULTIPLE TOOTH EXTRACTIONS     POLYPECTOMY      Prior to Admission medications   Medication Sig Start Date End Date Taking? Authorizing Provider  aspirin EC 81 MG tablet Take 81 mg by mouth daily.   Yes [provider]  esomeprazole (NEXIUM) 20 MG capsule Take 20 mg by mouth daily at 12 noon.   Yes [provider]  ferrous gluconate (FERGON) 324 MG tablet Take 324 mg by mouth daily.   Yes [provider]  hydrochlorothiazide (HYDRODIURIL) 25 MG tablet Take 25 mg by mouth daily.   Yes [provider]  irbesartan (AVAPRO) 150 MG tablet Take 150 mg by mouth daily.   Yes [provider]  metFORMIN (GLUCOPHAGE) 500 MG tablet Take by mouth every morning.   Yes [provider]  metoprolol succinate (TOPROL-XL) 100 MG 24 hr tablet Take 100 mg by mouth daily. Take with or immediately following a meal.   Yes [provider]  simvastatin (ZOCOR) 40 MG tablet Take 40 mg by mouth daily.   Yes [provider]    Current Outpatient Medications  Medication Sig Dispense Refill   aspirin EC 81 MG tablet Take 81 mg by mouth daily.     esomeprazole (NEXIUM) 20 MG capsule Take 20 mg by mouth daily at 12 noon.     ferrous gluconate (FERGON) 324 MG tablet Take 324 mg by mouth daily.     hydrochlorothiazide (HYDRODIURIL) 25 MG tablet Take 25 mg by mouth daily.     irbesartan (AVAPRO) 150 MG tablet Take 150 mg by mouth daily.     metFORMIN (GLUCOPHAGE) 500 MG tablet Take by mouth every morning.     metoprolol succinate (TOPROL-XL) 100 MG 24 hr tablet Take 100 mg by mouth daily. Take with or immediately following a meal.  simvastatin (ZOCOR) 40 MG tablet Take 40 mg by mouth daily.     Current Facility-Administered Medications  Medication Dose Route Frequency Provider Last Rate Last Admin   0.9 %  sodium chloride infusion  500 mL Intravenous Once Avyonna Wagoner, Carie Caddy, MD        Allergies as of 02/28/2023 - Review Complete 02/28/2023  Allergen Reaction Noted   Prednisone Other (See Comments) 02/10/2021    Family History  Problem Relation Age of Onset   Hypertension Mother    Alcohol abuse Mother    Heart disease Mother    Hypertension Father    Alcohol abuse Father    Colon cancer Sister        in her 57s   Colon polyps Sister    Breast cancer Sister    Esophageal cancer Sister    Hypertension Sister    Heart disease Sister    Peripheral vascular disease Sister    Lung cancer Sister    Heart disease Sister    Coronary artery disease Sister    COPD  Sister    Peptic Ulcer Sister    Anemia Sister    Skin cancer Brother    Heart disease Brother    Diabetes Brother    Stroke Brother    Hypertension Brother    Hyperlipidemia Brother    Cancer Brother        type unknown   Heart disease Brother    Hypertension Brother    Hyperlipidemia Brother    Diabetes Brother    Stroke Brother    Hypertension Brother    Hyperlipidemia Brother    Diabetes Brother    Stomach cancer Neg Hx    Rectal cancer Neg Hx     Social History   Socioeconomic History   Marital status: Married    Spouse name: Not on file   Number of children: 1   Years of education: Not on file   Highest education level: Not on file  Occupational History   Occupation: retired  Tobacco Use   Smoking status: Former    Packs/day: 1.00    Years: 25.00    Additional pack years: 0.00    Total pack years: 25.00    Types: Cigarettes    Quit date: 2014    Years since quitting: 10.4   Smokeless tobacco: Never  Vaping Use   Vaping Use: Never used  Substance and Sexual Activity   Alcohol use: Never   Drug use: Not Currently    Types: Marijuana    Comment: Last use March 22 2018   Sexual activity: Not on file  Other Topics Concern   Not on file  Social History Narrative   Not on file   Social Determinants of Health   Financial Resource Strain: Not on file  Food Insecurity: Not on file  Transportation Needs: Not on file  Physical Activity: Not on file  Stress: Not on file  Social Connections: Not on file  Intimate Partner Violence: Not on file    Physical Exam: Vital signs in last 24 hours: @BP  124/68   Pulse 63   Temp 98.6 F (37 C) (Temporal)   Ht 5\' 4"  (1.626 m)   Wt 161 lb (73 kg)   SpO2 98%   BMI 27.64 kg/m  GEN: NAD EYE: Sclerae anicteric ENT: MMM CV: Non-tachycardic Pulm: CTA b/l GI: Soft, NT/ND NEURO:  Alert & Oriented x 3   Erick Blinks, MD [facility]  02/28/2023 8:07 AM

## 2023-02-28 NOTE — Progress Notes (Signed)
VS completed by CW   Pt's states no medical or surgical changes since previsit or office visit.  

## 2023-02-28 NOTE — Op Note (Signed)
[facility] Patient Name: Steven Gates Procedure Date: 02/28/2023 7:06 AM MRN: [id number] Endoscopist: Beverley Fiedler , MD, [id number] Age: 62 Referring MD:  Date of Birth: 1961/05/07 Gender: Male Account #: [id number] Procedure:                Colonoscopy Indications:              High risk colon cancer surveillance: Personal                            history of multiple adenomas (12 in 2019, 1 in                            2020), Incidental - Iron deficiency anemia Medicines:                Monitored Anesthesia Care Procedure:                Pre-Anesthesia Assessment:                           - Prior to the procedure, a History and Physical                            was performed, and patient medications and                            allergies were reviewed. The patient's tolerance of                            previous anesthesia was also reviewed. The risks                            and benefits of the procedure and the sedation                            options and risks were discussed with the patient.                            All questions were answered, and informed consent                            was obtained. Prior Anticoagulants: The patient has                            taken no anticoagulant or antiplatelet agents. ASA                            Grade Assessment: II - A patient with mild systemic                            disease. After reviewing the risks and benefits,                            the patient was deemed in satisfactory condition to  undergo the procedure.                           After obtaining informed consent, the colonoscope                            was passed under direct vision. Throughout the                            procedure, the patient's blood pressure, pulse, and                            oxygen saturations were monitored continuously. The                            Olympus CF-HQ190L  [id number]) Colonoscope was                            introduced through the anus and advanced to the                            terminal ileum. The colonoscopy was performed                            without difficulty. The patient tolerated the                            procedure well. The quality of the bowel                            preparation was good. The ileocecal valve,                            appendiceal orifice, and rectum were photographed. Scope In: 8:23:09 AM Scope Out: 8:40:21 AM Scope Withdrawal Time: 0 hours 12 minutes 27 seconds  Total Procedure Duration: 0 hours 17 minutes 12 seconds  Findings:                 Hemorrhoids were found on perianal exam.                           The terminal ileum appeared normal.                           Two sessile polyps were found in the ascending                            colon. The polyps were 3 to 7 mm in size. These                            polyps were removed with a cold snare. Resection                            and retrieval were complete.  A 4 mm polyp was found in the transverse colon. The                            polyp was sessile. The polyp was removed with a                            cold snare. Resection and retrieval were complete.                           Multiple large-mouthed, medium-mouthed and                            small-mouthed diverticula were found in the sigmoid                            colon, descending colon and ascending colon.                           External and internal hemorrhoids were found during                            retroflexion. The hemorrhoids were medium-sized. Complications:            No immediate complications. Estimated Blood Loss:     Estimated blood loss was minimal. Impression:               - The examined portion of the ileum was normal.                           - Two 3 to 7 mm polyps in the ascending colon,                             removed with a cold snare. Resected and retrieved.                           - One 4 mm polyp in the transverse colon, removed                            with a cold snare. Resected and retrieved.                           - Moderate diverticulosis in the sigmoid colon, in                            the descending colon and in the ascending colon.                           - External and internal hemorrhoids. Recommendation:           - Patient has a contact number available for                            emergencies. The signs and symptoms of potential  delayed complications were discussed with the                            patient. Return to normal activities tomorrow.                            Written discharge instructions were provided to the                            patient.                           - Resume previous diet.                           - Continue present medications. Continue oral iron                            with attention to Hgb and iron studies.                           - Await pathology results.                           - Repeat colonoscopy is recommended for                            surveillance. The colonoscopy date will be                            determined after pathology results from today's                            exam become available for review. Beverley Fiedler, MD 02/28/2023 8:51:58 AM This report has been signed electronically.

## 2023-02-28 NOTE — Op Note (Signed)
[facility] Patient Name: Steven Gates Procedure Date: 02/28/2023 7:20 AM MRN: [id number] Endoscopist: Beverley Fiedler , MD, [id number] Age: 62 Referring MD:  Date of Birth: August 15, 1961 Gender: Male Account #: [id number] Procedure:                Upper GI endoscopy Indications:              Iron deficiency anemia, Gastro-esophageal reflux                            disease, currently on Nexium 20 mg daily Medicines:                Monitored Anesthesia Care Procedure:                Pre-Anesthesia Assessment:                           - Prior to the procedure, a History and Physical                            was performed, and patient medications and                            allergies were reviewed. The patient's tolerance of                            previous anesthesia was also reviewed. The risks                            and benefits of the procedure and the sedation                            options and risks were discussed with the patient.                            All questions were answered, and informed consent                            was obtained. Prior Anticoagulants: The patient has                            taken no anticoagulant or antiplatelet agents. ASA                            Grade Assessment: II - A patient with mild systemic                            disease. After reviewing the risks and benefits,                            the patient was deemed in satisfactory condition to                            undergo the procedure.  After obtaining informed consent, the endoscope was                            passed under direct vision. Throughout the                            procedure, the patient's blood pressure, pulse, and                            oxygen saturations were monitored continuously. The                            Olympus Scope [id number] was introduced through the                            mouth, and advanced  to the second part of duodenum.                            The upper GI endoscopy was accomplished without                            difficulty. The patient tolerated the procedure                            well. Scope In: Scope Out: Findings:                 LA Grade C (one or more mucosal breaks continuous                            between tops of 2 or more mucosal folds, less than                            75% circumference) esophagitis was found 35 to 38                            cm from the incisors. Biopsies were taken with a                            cold forceps for histology.                           A 3 cm hiatal hernia was present.                           Scattered mild inflammation characterized by                            congestion (edema), erythema and granularity was                            found in the gastric body and in the gastric  antrum. Biopsies were taken with a cold forceps for                            histology and Helicobacter pylori testing.                           The examined duodenum was normal. Complications:            No immediate complications. Estimated Blood Loss:     Estimated blood loss was minimal. Impression:               - LA Grade C reflux esophagitis. Biopsied.                           - 3 cm hiatal hernia.                           - Gastritis. Biopsied.                           - Normal examined duodenum. Recommendation:           - Patient has a contact number available for                            emergencies. The signs and symptoms of potential                            delayed complications were discussed with the                            patient. Return to normal activities tomorrow.                            Written discharge instructions were provided to the                            patient.                           - Resume previous diet.                           - Continue  present medications. Increase Nexium to                            40 mg BID-AC (okay to substitute pantoprazole 40 mg                            BID-AC if preferred by insurance coverage).                           - Await pathology results.                           - Repeat upper endoscopy in 3 months to check  healing.                           - See the other procedure note for documentation of                            additional recommendations. Beverley Fiedler, MD 02/28/2023 8:47:14 AM This report has been signed electronically.

## 2023-02-28 NOTE — Patient Instructions (Addendum)
- Continue present medications. Increase Nexium to 40 mg BID-AC (okay to substitute pantoprazole 40 mg BID-AC if preferred by insurance coverage). - Await pathology results. - Repeat upper endoscopy in 3 months to check healing. - Resume previous diet. - Continue present medications. Continue oral iron with attention to Hgb and iron studies. - Await pathology results. - Repeat colonoscopy is recommended for surveillance. The colonoscopy date will be determined after pathology results from today's exam become available for review.   YOU HAD AN ENDOSCOPIC PROCEDURE TODAY AT THE [facility]:   Refer to the procedure report that was given to you for any specific questions about what was found during the examination.  If the procedure report does not answer your questions, please call your gastroenterologist to clarify.  If you requested that your care partner not be given the details of your procedure findings, then the procedure report has been included in a sealed envelope for you to review at your convenience later.  YOU SHOULD EXPECT: Some feelings of bloating in the abdomen. Passage of more gas than usual.  Walking can help get rid of the air that was put into your GI tract during the procedure and reduce the bloating. If you had a lower endoscopy (such as a colonoscopy or flexible sigmoidoscopy) you may notice spotting of blood in your stool or on the toilet paper. If you underwent a bowel prep for your procedure, you may not have a normal bowel movement for a few days.  Please Note:  You might notice some irritation and congestion in your nose or some drainage.  This is from the oxygen used during your procedure.  There is no need for concern and it should clear up in a day or so.  SYMPTOMS TO REPORT IMMEDIATELY:  Following lower endoscopy (colonoscopy or flexible sigmoidoscopy):  Excessive amounts of blood in the stool  Significant tenderness or worsening of abdominal  pains  Swelling of the abdomen that is new, acute  Fever of 100F or higher  Following upper endoscopy (EGD)  Vomiting of blood or coffee ground material  New chest pain or pain under the shoulder blades  Painful or persistently difficult swallowing  New shortness of breath  Fever of 100F or higher  Black, tarry-looking stools  For urgent or emergent issues, a gastroenterologist can be reached at any hour by calling [phone]. Do not use MyChart messaging for urgent concerns.    DIET:  We do recommend a small meal at first, but then you may proceed to your regular diet.  Drink plenty of fluids but you should avoid alcoholic beverages for 24 hours.  ACTIVITY:  You should plan to take it easy for the rest of today and you should NOT DRIVE or use heavy machinery until tomorrow (because of the sedation medicines used during the test).    FOLLOW UP: Our staff will call the number listed on your records the next business day following your procedure.  We will call around 7:15- 8:00 am to check on you and address any questions or concerns that you may have regarding the information given to you following your procedure. If we do not reach you, we will leave a message.     If any biopsies were taken you will be contacted by phone or by letter within the next 1-3 weeks.  Please call us at [phone] if you have not heard about the biopsies in 3 weeks.    SIGNATURES/CONFIDENTIALITY: You and/or your care  partner have signed paperwork which will be entered into your electronic medical record.  These signatures attest to the fact that that the information above on your After Visit Summary has been reviewed and is understood.  Full responsibility of the confidentiality of this discharge information lies with you and/or your care-partner.

## 2023-02-28 NOTE — Progress Notes (Signed)
Sedate, gd SR, tolerated procedure well, VSS, report to RN 

## 2023-02-28 NOTE — Progress Notes (Signed)
Called to room to assist during endoscopic procedure.  Patient ID and intended procedure confirmed with present staff. Received instructions for my participation in the procedure from the performing physician.  

## 2023-03-01 ENCOUNTER — Telehealth: Payer: Self-pay

## 2023-03-01 MED ORDER — ESOMEPRAZOLE MAGNESIUM 40 MG PO CPDR: 40.0000 mg | DELAYED_RELEASE_CAPSULE | Freq: Two times a day (BID) | ORAL | 0 refills | Status: DC

## 2023-03-01 NOTE — Telephone Encounter (Signed)
  Follow up Call-     02/28/2023    7:12 AM  Call back number  Post procedure Call Back phone  # [phone]  Permission to leave phone message Yes     Patient questions:  Do you have a fever, pain , or abdominal swelling? No. Pain Score  0 *  Have you tolerated food without any problems? Yes.    Have you been able to return to your normal activities? Yes.    Do you have any questions about your discharge instructions: Diet   No. Medications  No. Follow up visit  No.  Do you have questions or concerns about your Care? No.  Actions: * If pain score is 4 or above: No action needed, pain <4.

## 2023-03-06 ENCOUNTER — Encounter: Payer: Self-pay | Admitting: Internal Medicine

## 2023-03-22 ENCOUNTER — Ambulatory Visit (AMBULATORY_SURGERY_CENTER)

## 2023-03-22 VITALS — Ht 65.0 in | Wt 165.0 lb

## 2023-03-22 DIAGNOSIS — K219 Gastro-esophageal reflux disease without esophagitis: Secondary | ICD-10-CM

## 2023-03-22 DIAGNOSIS — D509 Iron deficiency anemia, unspecified: Secondary | ICD-10-CM

## 2023-03-22 NOTE — Progress Notes (Signed)
Pre visit completed via phone call; Patient verified name, DOB, and address;  No egg or soy allergy known to patient  No issues known to pt with past sedation with any surgeries or procedures Patient denies ever being told they had issues or difficulty with intubation  No FH of Malignant Hyperthermia Pt is not on diet pills Pt is not on home 02  Pt is not on blood thinners  Pt denies issues with constipation;  No A fib or A flutter Have any cardiac testing pending--NO Pt instructed to use Singlecare.com or GoodRx for a price reduction on prep   Insurance verified during PV appt=[employer]  Patient's chart reviewed by Cathlyn Parsons CNRA prior to previsit and patient appropriate for the [facility].  Previsit completed and red dot placed by patient's name on their procedure day (on provider's schedule).    Instructions sent via MyChart per patient request;

## 2023-05-19 NOTE — Progress Notes (Signed)
This encounter was created in error - please disregard.

## 2023-05-30 ENCOUNTER — Telehealth: Payer: Self-pay | Admitting: Internal Medicine

## 2023-05-30 NOTE — Telephone Encounter (Signed)
Inbound call from patients wife requesting a call to discuss what medications patient can and can not take for his endoscopy tomorrow. Please advise.

## 2023-05-30 NOTE — Telephone Encounter (Signed)
Returned the patient's wife's phone call and discussed meds patient should take in the am before his procedure. She verbalized understanding.

## 2023-05-31 ENCOUNTER — Ambulatory Visit (AMBULATORY_SURGERY_CENTER): Admitting: Internal Medicine

## 2023-05-31 ENCOUNTER — Encounter: Payer: Self-pay | Admitting: Internal Medicine

## 2023-05-31 ENCOUNTER — Telehealth: Payer: Self-pay

## 2023-05-31 VITALS — BP 118/77 | HR 67 | Temp 98.4°F | Resp 11 | Ht 65.0 in | Wt 165.0 lb

## 2023-05-31 DIAGNOSIS — K209 Esophagitis, unspecified without bleeding: Secondary | ICD-10-CM

## 2023-05-31 DIAGNOSIS — K317 Polyp of stomach and duodenum: Secondary | ICD-10-CM

## 2023-05-31 DIAGNOSIS — K2100 Gastro-esophageal reflux disease with esophagitis, without bleeding: Secondary | ICD-10-CM | POA: Diagnosis not present

## 2023-05-31 MED ORDER — SODIUM CHLORIDE 0.9 % IV SOLN
500.0000 mL | INTRAVENOUS | Status: DC
Start: 2023-05-31 — End: 2023-05-31

## 2023-05-31 NOTE — Progress Notes (Signed)
GASTROENTEROLOGY PROCEDURE H&P NOTE   Primary Care Physician: Corrington, Meredith Mody, MD    Reason for Procedure:  Hx of esophagitis  Plan:    EGD  Patient is appropriate for endoscopic procedure(s) in the ambulatory ([facility]) setting.  The nature of the procedure, as well as the risks, benefits, and alternatives were carefully and thoroughly reviewed with the patient. Ample time for discussion and questions allowed. The patient understood, was satisfied, and agreed to proceed.     HPI: Steven Gates is a 62 y.o. male who presents for EGD.  Medical history as below.  No recent chest pain or shortness of breath.  No abdominal pain today.  Past Medical History:  Diagnosis Date   Allergy    mild   Arthritis    hands    Cancer (HCC)    skin cancer - spot removed from neck   Diabetes mellitus without complication (HCC)    Diverticulosis    GERD (gastroesophageal reflux disease)    Hx of adenomatous colonic polyps    x 12  on 04-19-2018   Hyperlipidemia    Hypertension    IDA (iron deficiency anemia)    Internal hemorrhoids     Past Surgical History:  Procedure Laterality Date   APPENDECTOMY     carpel tunnel Right    CERVICAL SPINE SURGERY     C3-C4 - plate   COLONOSCOPY     greater than 10 yrs  in [place]   ELBOW SURGERY Bilateral    elbow surgery x 2 - bilateral ulnar nerve   ESOPHAGOGASTRODUODENOSCOPY  02/2023   3 cm HH/gastritis/recall 3 mth   MULTIPLE TOOTH EXTRACTIONS     POLYPECTOMY      Prior to Admission medications   Medication Sig Start Date End Date Taking? Authorizing Provider  aspirin EC 81 MG tablet Take 81 mg by mouth daily.   Yes [provider]  esomeprazole (NEXIUM) 40 MG capsule Take 1 capsule (40 mg total) by mouth 2 (two) times daily before a meal. 03/01/23  Yes Lanita Stammen, Carie Caddy, MD  ferrous gluconate (FERGON) 324 MG tablet Take 324 mg by mouth daily.   Yes [provider]  hydrochlorothiazide (HYDRODIURIL) 25 MG tablet Take 25  mg by mouth daily.   Yes [provider]  irbesartan (AVAPRO) 150 MG tablet Take 150 mg by mouth daily.   Yes [provider]  metFORMIN (GLUCOPHAGE) 500 MG tablet Take 1,000 mg by mouth 2 (two) times daily with a meal.   Yes [provider]  metoprolol succinate (TOPROL-XL) 100 MG 24 hr tablet Take 100 mg by mouth daily. Take with or immediately following a meal.   Yes [provider]  simvastatin (ZOCOR) 40 MG tablet Take 40 mg by mouth daily.   Yes [provider]    Current Outpatient Medications  Medication Sig Dispense Refill   aspirin EC 81 MG tablet Take 81 mg by mouth daily.     esomeprazole (NEXIUM) 40 MG capsule Take 1 capsule (40 mg total) by mouth 2 (two) times daily before a meal. 180 capsule 0   ferrous gluconate (FERGON) 324 MG tablet Take 324 mg by mouth daily.     hydrochlorothiazide (HYDRODIURIL) 25 MG tablet Take 25 mg by mouth daily.     irbesartan (AVAPRO) 150 MG tablet Take 150 mg by mouth daily.     metFORMIN (GLUCOPHAGE) 500 MG tablet Take 1,000 mg by mouth 2 (two) times daily with a meal.  metoprolol succinate (TOPROL-XL) 100 MG 24 hr tablet Take 100 mg by mouth daily. Take with or immediately following a meal.     simvastatin (ZOCOR) 40 MG tablet Take 40 mg by mouth daily.     Current Facility-Administered Medications  Medication Dose Route Frequency Provider Last Rate Last Admin   0.9 %  sodium chloride infusion  500 mL Intravenous Continuous Lorea Kupfer, Carie Caddy, MD        Allergies as of 05/31/2023 - Review Complete 05/31/2023  Allergen Reaction Noted   Prednisone Other (See Comments) 02/10/2021    Family History  Problem Relation Age of Onset   Hypertension Mother    Alcohol abuse Mother    Heart disease Mother    Hypertension Father    Alcohol abuse Father    Colon cancer Sister        in her 29s   Colon polyps Sister    Breast cancer Sister    Esophageal cancer Sister    Hypertension Sister    Heart  disease Sister    Peripheral vascular disease Sister    Lung cancer Sister    Heart disease Sister    Coronary artery disease Sister    COPD Sister    Peptic Ulcer Sister    Anemia Sister    Skin cancer Brother    Heart disease Brother    Diabetes Brother    Stroke Brother    Hypertension Brother    Hyperlipidemia Brother    Cancer Brother        type unknown   Heart disease Brother    Hypertension Brother    Hyperlipidemia Brother    Diabetes Brother    Stroke Brother    Hypertension Brother    Hyperlipidemia Brother    Diabetes Brother    Stomach cancer Neg Hx    Rectal cancer Neg Hx     Social History   Socioeconomic History   Marital status: Married    Spouse name: Not on file   Number of children: 1   Years of education: Not on file   Highest education level: Not on file  Occupational History   Occupation: retired  Tobacco Use   Smoking status: Former    Current packs/day: 0.00    Average packs/day: 1 pack/day for 25.0 years (25.0 ttl pk-yrs)    Types: Cigarettes    Start date: 36    Quit date: 2014    Years since quitting: 10.7   Smokeless tobacco: Never  Vaping Use   Vaping status: Never Used  Substance and Sexual Activity   Alcohol use: Never   Drug use: Not Currently    Types: Marijuana    Comment: Last use March 22 2018   Sexual activity: Not on file  Other Topics Concern   Not on file  Social History Narrative   Not on file   Social Determinants of Health   Financial Resource Strain: Medium Risk (10/17/2022)   Received from [facility], [facility]   Overall Financial Resource Strain (CARDIA)    Difficulty of Paying Living Expenses: Somewhat hard  Food Insecurity: No Food Insecurity (10/17/2022)   Received from [facility], [facility]   Hunger Vital Sign    Worried About Running Out of Food in the Last Year: Never true    Ran Out of Food in the Last Year: Never true  Transportation Needs: No Transportation Needs (10/17/2022)    Received from [facility], [facility]   PRAPARE -  Administrator, Civil Service (Medical): No    Lack of Transportation (Non-Medical): No  Physical Activity: Not on file  Stress: Not on file  Social Connections: Unknown (01/17/2022)   Received from [facility], [facility]   Social Network    Social Network: Not on file  Intimate Partner Violence: Unknown (12/20/2021)   Received from [facility], [facility]   HITS    Physically Hurt: Not on file    Insult or Talk Down To: Not on file    Threaten Physical Harm: Not on file    Scream or Curse: Not on file    Physical Exam: Vital signs in last 24 hours: @BP  125/68   Pulse 64   Temp 98.4 F (36.9 C)   Ht 5\' 5"  (1.651 m)   Wt 165 lb (74.8 kg)   SpO2 97%   BMI 27.46 kg/m  GEN: NAD EYE: Sclerae anicteric ENT: MMM CV: Non-tachycardic Pulm: CTA b/l GI: Soft, NT/ND NEURO:  Alert & Oriented x 3   Erick Blinks, MD [facility]  05/31/2023 10:25 AM

## 2023-05-31 NOTE — Telephone Encounter (Signed)
-----   Message from Carie Caddy Pyrtle sent at 05/31/2023 11:11 AM EDT ----- Cbc, ferritin and IBC in 3 months OV with me in Jan or Feb GERD JMP

## 2023-05-31 NOTE — Progress Notes (Signed)
Pt's states no medical or surgical changes since previsit or office visit. 

## 2023-05-31 NOTE — Telephone Encounter (Signed)
Scheduled patient for follow-up visit with Dr. Rhea Belton on 10/01/22 at 10:10 am. Also, informed patient that I will contact him in 3 months to come in for repeat lab work. Patient verbalized understanding.

## 2023-05-31 NOTE — Progress Notes (Signed)
Called to room to assist during endoscopic procedure.  Patient ID and intended procedure confirmed with present staff. Received instructions for my participation in the procedure from the performing physician.  

## 2023-05-31 NOTE — Patient Instructions (Signed)
Handout provided on hiatal hernia.  Resume previous diet.  Continue present medications. Can reduce Nexium to 40mg  once daily. If heartburn or reflux symptoms return with once daily dosing then okay to resume twice daily Nexium at 40mg  each capsule.  Repeat upper endoscopy in 1 year for surveillance.   YOU HAD AN ENDOSCOPIC PROCEDURE TODAY AT THE [facility]:   Refer to the procedure report that was given to you for any specific questions about what was found during the examination.  If the procedure report does not answer your questions, please call your gastroenterologist to clarify.  If you requested that your care partner not be given the details of your procedure findings, then the procedure report has been included in a sealed envelope for you to review at your convenience later.  YOU SHOULD EXPECT: Some feelings of bloating in the abdomen. Passage of more gas than usual.  Walking can help get rid of the air that was put into your GI tract during the procedure and reduce the bloating. If you had a lower endoscopy (such as a colonoscopy or flexible sigmoidoscopy) you may notice spotting of blood in your stool or on the toilet paper. If you underwent a bowel prep for your procedure, you may not have a normal bowel movement for a few days.  Please Note:  You might notice some irritation and congestion in your nose or some drainage.  This is from the oxygen used during your procedure.  There is no need for concern and it should clear up in a day or so.  SYMPTOMS TO REPORT IMMEDIATELY:  Following upper endoscopy (EGD)  Vomiting of blood or coffee ground material  New chest pain or pain under the shoulder blades  Painful or persistently difficult swallowing  New shortness of breath  Fever of 100F or higher  Black, tarry-looking stools  For urgent or emergent issues, a gastroenterologist can be reached at any hour by calling [phone]. Do not use MyChart messaging for urgent  concerns.    DIET:  We do recommend a small meal at first, but then you may proceed to your regular diet.  Drink plenty of fluids but you should avoid alcoholic beverages for 24 hours.  ACTIVITY:  You should plan to take it easy for the rest of today and you should NOT DRIVE or use heavy machinery until tomorrow (because of the sedation medicines used during the test).    FOLLOW UP: Our staff will call the number listed on your records the next business day following your procedure.  We will call around 7:15- 8:00 am to check on you and address any questions or concerns that you may have regarding the information given to you following your procedure. If we do not reach you, we will leave a message.     If any biopsies were taken you will be contacted by phone or by letter within the next 1-3 weeks.  Please call us at [phone] if you have not heard about the biopsies in 3 weeks.    SIGNATURES/CONFIDENTIALITY: You and/or your care partner have signed paperwork which will be entered into your electronic medical record.  These signatures attest to the fact that that the information above on your After Visit Summary has been reviewed and is understood.  Full responsibility of the confidentiality of this discharge information lies with you and/or your care-partner. \

## 2023-05-31 NOTE — Progress Notes (Signed)
Patient coughed with copious amounts of clear secretions throughout EGD. Oropharynx suctioned with [place]. Patient did have a transient O2 desaturation, treated with jaw thrust and increased FiO2. Responded well to interventions. To recovery and placed on [place] O2. Patient still coughing clear secretions in pacu. HOB elevated throughout procedure and into recovery room. Able to maintain O2 saturations, opening eyes appropriately. Report to pacu rn. Vss. Care resumed by rn.   WOULD RECOMMEND GLYCOPYRROLATE PRIOR TO SEDATION IN THE FUTURE.

## 2023-05-31 NOTE — Op Note (Signed)
[facility] Patient Name: Steven Gates Procedure Date: 05/31/2023 10:20 AM MRN: [id number] Endoscopist: Beverley Fiedler , MD, [id number] Age: 62 Referring MD:  Date of Birth: Apr 03, 1961 Gender: Male Account #: [id number] Procedure:                Upper GI endoscopy Indications:              Follow-up of reflux esophagitis (seen at EGD 12                            weeks ago), now Nexium 40 mg BID and pt reports                            resolution of GERD symptoms and no dysphagia Medicines:                Monitored Anesthesia Care Procedure:                Pre-Anesthesia Assessment:                           - Prior to the procedure, a History and Physical                            was performed, and patient medications and                            allergies were reviewed. The patient's tolerance of                            previous anesthesia was also reviewed. The risks                            and benefits of the procedure and the sedation                            options and risks were discussed with the patient.                            All questions were answered, and informed consent                            was obtained. Prior Anticoagulants: The patient has                            taken no anticoagulant or antiplatelet agents. ASA                            Grade Assessment: II - A patient with mild systemic                            disease. After reviewing the risks and benefits,                            the patient was deemed in satisfactory condition to  undergo the procedure.                           After obtaining informed consent, the endoscope was                            passed under direct vision. Throughout the                            procedure, the patient's blood pressure, pulse, and                            oxygen saturations were monitored continuously. The                            GIF [id number]  #[id number] was introduced through the                            mouth, and advanced to the second part of duodenum.                            The upper GI endoscopy was accomplished without                            difficulty. The patient tolerated the procedure                            well. Scope In: Scope Out: Findings:                 The Z-line was irregular and was found 35 cm from                            the incisors.                           There is no endoscopic evidence of esophagitis in                            the distal esophagus and at the gastroesophageal                            junction.                           A 3 cm hiatal hernia was present.                           A single 4 mm mucosal papule (nodule) was found in                            the cardia. This is located just distal to the GE                            junction. Biopsies were taken with a cold forceps  for histology.                           The exam of the stomach was otherwise normal.                           The examined duodenum was normal. Complications:            No immediate complications. Estimated Blood Loss:     Estimated blood loss was minimal. Impression:               - Z-line irregular, 35 cm from the incisors.                           - Esophagitis seen previously has healed with BID                            Nexium.                           - 3 cm hiatal hernia.                           - A single mucosal nodule found in the gastric                            cardia just distal to GE junction/Z-line. Biopsied.                           - Normal examined duodenum. Recommendation:           - Patient has a contact number available for                            emergencies. The signs and symptoms of potential                            delayed complications were discussed with the                            patient. Return to normal  activities tomorrow.                            Written discharge instructions were provided to the                            patient.                           - Resume previous diet.                           - Continue present medications. Can reduce to                            Nexium 40 mg once daily. If heartburn or reflux  symptoms return with once daily dosing then okay to                            resume twice daily Nexium at 40 mg each capsule.                           - Await pathology results.                           - Repeat upper endoscopy in 1 year for surveillance. Beverley Fiedler, MD 05/31/2023 10:53:21 AM This report has been signed electronically.

## 2023-06-01 ENCOUNTER — Telehealth: Payer: Self-pay

## 2023-06-01 ENCOUNTER — Other Ambulatory Visit: Payer: Self-pay | Admitting: Internal Medicine

## 2023-06-01 NOTE — Telephone Encounter (Signed)
Follow up call to pt, no answer. 

## 2023-06-02 LAB — SURGICAL PATHOLOGY

## 2023-06-05 ENCOUNTER — Encounter: Payer: Self-pay | Admitting: Internal Medicine

## 2023-08-28 ENCOUNTER — Telehealth: Payer: Self-pay

## 2023-08-28 NOTE — Telephone Encounter (Signed)
Left message for patient to return my call.

## 2023-08-28 NOTE — Telephone Encounter (Signed)
-----   Message from [facility] Marchelle Folks M sent at 05/31/2023  1:49 PM EDT ----- Contact patient to have him come in for labs. CBC, IBC and ferritin for dx: GERD. He already has appt scheduled. ----- Message ----- From: Beverley Fiedler, MD Sent: 05/31/2023  11:11 AM EDT To: Illene Bolus, CMA  Cbc, ferritin and IBC in 3 months OV with me in Jan or Feb GERD JMP

## 2023-08-29 NOTE — Telephone Encounter (Signed)
Informed patient he is due for his repeat labs. Patient states his hematologist in [place] is drawing labs such as iron studies on Monday (12/16) for his anemia. Informed patient to have his hematologist (Dr. Neil Crouch) fax [place] the results. Will contact office if we do not receive the results.

## 2023-08-29 NOTE — Telephone Encounter (Signed)
Left message for patient to return my call.

## 2023-09-05 NOTE — Telephone Encounter (Signed)
Dr. Rhea Belton,  You wanted patient to have repeat labs for IDA. Patient reports having these labs drawn at his hematologist's office. Labs (CBC, IBC and ferritin) are in care everywhere for review.

## 2023-09-06 NOTE — Telephone Encounter (Signed)
Blood counts and iron studies look good He can continue to follow with hematology

## 2023-09-07 NOTE — Telephone Encounter (Signed)
Left message for patient to contact our office.

## 2023-09-08 NOTE — Telephone Encounter (Signed)
Called and left a detailed message informing patient that his labs look good and to follow up with his hematologist.

## 2023-10-02 ENCOUNTER — Encounter: Payer: Self-pay | Admitting: Internal Medicine

## 2023-10-02 ENCOUNTER — Ambulatory Visit: Admitting: Internal Medicine

## 2023-10-02 VITALS — BP 130/78 | HR 62 | Ht 65.0 in | Wt 162.0 lb

## 2023-10-02 DIAGNOSIS — Z860101 Personal history of adenomatous and serrated colon polyps: Secondary | ICD-10-CM

## 2023-10-02 DIAGNOSIS — D509 Iron deficiency anemia, unspecified: Secondary | ICD-10-CM

## 2023-10-02 DIAGNOSIS — K2100 Gastro-esophageal reflux disease with esophagitis, without bleeding: Secondary | ICD-10-CM

## 2023-10-02 DIAGNOSIS — Z860100 Personal history of colon polyps, unspecified: Secondary | ICD-10-CM

## 2023-10-02 MED ORDER — ESOMEPRAZOLE MAGNESIUM 40 MG PO CPDR: 40.0000 mg | DELAYED_RELEASE_CAPSULE | Freq: Every day | ORAL | 3 refills | Status: DC

## 2023-10-02 NOTE — Patient Instructions (Signed)
 We have sent the following medications to your pharmacy for you to pick up at your convenience: Nexium  40 mg daily.   Please follow up with Dr. Elwin in one year.  _______________________________________________________  If your blood pressure at your visit was 140/90 or greater, please contact your primary care physician to follow up on this.  _______________________________________________________  If you are age 63 or older, your body mass index should be between 23-30. Your Body mass index is 26.96 kg/m. If this is out of the aforementioned range listed, please consider follow up with your Primary Care Provider.  If you are age 35 or younger, your body mass index should be between 19-25. Your Body mass index is 26.96 kg/m. If this is out of the aformentioned range listed, please consider follow up with your Primary Care Provider.   ________________________________________________________  The [facility] providers would like to encourage you to use MYCHART to communicate with providers for non-urgent requests or questions.  Due to long hold times on the telephone, sending your provider a message by [facility] may be a faster and more efficient way to get a response.  Please allow 48 business hours for a response.  Please remember that this is for non-urgent requests.  _______________________________________________________

## 2023-10-02 NOTE — Progress Notes (Signed)
   Subjective:    Patient ID: Steven Gates, male    DOB: [birth year], 63 y.o.   MRN: [id number]  HPI Steven Gates is a 63 year old male with a history of GERD with esophagitis, hiatal hernia, multiple adenomatous colonic polyps and iron deficiency anemia who is here for follow-up.  He is here alone today and was last seen on 05/31/2019 for the time of his last EGD.  He had 2 EGDs last year in June and September.  The second EGD to follow-up esophagitis revealed irregular Z-line but his previously seen esophagitis had healed.  A 3 cm hiatal hernia and a single 4 mm nodule in the gastric cardia which was biopsied.  The exam was otherwise normal.  Biopsies showed the squamocolumnar junction with chronic and reactive inflammation with changes of reflux without Barrett's.  The small nodule was a hyperplastic polyp without dysplasia.  He had colonoscopy on 02/28/2023 to follow-up 12 adenomatous polyp seen in 2019 and 1 in 2020.  3 polyps were removed all subcentimeter and found to be adenoma x 3.  The patient, under the care of a hematologist for anemia, reports continued daily iron supplementation. He denies any adverse effects from the iron tablets. Recent blood tests indicate improved iron levels and hemoglobin count.  The patient also reports a significant improvement in reflux symptoms. Previously, he experienced severe nocturnal heartburn, even with minimal water intake. Since starting Nexium , the reflux symptoms have significantly reduced, with only one episode of nocturnal indigestion reported. The patient has successfully reduced Nexium  to once daily from the initial twice-daily regimen.  The patient maintains an active lifestyle and has managed to maintain a healthy weight, even after retirement. He reports no changes in bowel habits and denies any issues with swallowing.   Review of Systems As per HPI, otherwise negative  Current Medications, Allergies, Past Medical History, Past Surgical  History, Family History and Social History were reviewed in [employer] record.    Objective:   Physical Exam BP 130/78   Pulse 62   Ht 5' 5 (1.651 m)   Wt 162 lb (73.5 kg)   BMI 26.96 kg/m  Gen: awake, alert, NAD HEENT: anicteric  Abd: soft, NT/ND, +BS throughout Ext: no c/c/e Neuro: nonfocal  Labs reviewed by care everywhere Hemoglobin 13.5, MCV 90.5, white count 8.8 and platelet count 234 on 09/04/2023 Ferritin 58, percent iron saturation 20     Assessment & Plan:  63 year old male with a history of GERD with esophagitis, hiatal hernia, multiple adenomatous colonic polyps and iron deficiency anemia who is here for follow-up.   Iron Deficiency Anemia Improved iron studies and hemoglobin levels on daily oral iron supplementation. Hematology is managing. -Continue daily iron supplementation as directed by Hematology.  Gastroesophageal Reflux Disease (GERD) with Esophagitis Improved symptoms on Nexium  40 mg and successfully weaned down to once daily. No Barrett's esophagus identified on endoscopy. -Continue Nexium  once daily. -Return in 1 year for routine follow-up; EGD recommended around that time.  History of multiple adenomatous colonic polyps -Follow standard surveillance interval for repeat colonoscopy; per him June 2027.

## 2024-01-10 ENCOUNTER — Other Ambulatory Visit: Payer: Self-pay

## 2024-01-10 ENCOUNTER — Other Ambulatory Visit (INDEPENDENT_AMBULATORY_CARE_PROVIDER_SITE_OTHER): Payer: Self-pay

## 2024-01-10 ENCOUNTER — Ambulatory Visit: Admitting: Orthopedic Surgery

## 2024-01-10 DIAGNOSIS — M25552 Pain in left hip: Secondary | ICD-10-CM

## 2024-01-10 DIAGNOSIS — M79605 Pain in left leg: Secondary | ICD-10-CM | POA: Diagnosis not present

## 2024-01-10 DIAGNOSIS — M25551 Pain in right hip: Secondary | ICD-10-CM

## 2024-01-12 ENCOUNTER — Encounter: Payer: Self-pay | Admitting: Orthopedic Surgery

## 2024-01-12 NOTE — Progress Notes (Signed)
 Office Visit Note   [name]           Date of Birth: 1961/02/07           MRN: [id number] Visit Date: 01/10/2024 Requested by: Chandler Combs, MD [phone] [address] [place],  [place] [address] PCP: Chandler Combs, MD  Subjective: Chief Complaint  Patient presents with   Left Hip - Pain    HPI: Steven Gates is a 63 y.o. male who presents to the office reporting left hip pain.  Patient states his left hip is getting worse and getting weaker.  At times he has to stop and rest.  Symptoms going on for longer than a year.  His job is very physical.  Pain does radiate down left leg to the ankle.  Denies much in the way of discrete back pain.  All the pain is in his hip.  Describes relatively deep pain.  Actually caused him to drop a trailer hitch on the right leg.  The pain is "deep in the socket".  Denies any numbness and tingling just reports weakness.  When he stops and rest after he is walking his pain is improved.  Patient states that his strength loss comes and goes.  He has been doing physical therapy type exercises as part of his job.  Does not localize pain to the trochanteric region..                ROS: All systems reviewed are negative as they relate to the chief complaint within the history of present illness.  Patient denies fevers or chills.  Assessment & Plan: Visit Diagnoses:  1. Pain of right hip   2. Pain in left leg     Plan: Impression is hip and back pain.  Symptoms going on for longer than 12 months.  Failure of conservative therapy including anti-inflammatories as well as physical therapy type exercises which is primarily what he does at work.  He needs MRI pelvis to evaluate left hip pain with possible occult arthritis and/or labral pathology.  Also needs MRI of the lumbar spine to evaluate left-sided radiculopathy with his slight hip flexor weakness as well as pain radiating up and down the left leg.  Slight hip flexor weakness as well as pain  radiating up and down the left leg.  Pedal pulses are palpable.  Neurogenic claudication likely with far lateral disc herniation the most likely prospect in the differential.  Follow-Up Instructions: No follow-ups on file.   Orders:  Orders Placed This Encounter  Procedures   XR HIP UNILAT W OR W/O PELVIS 2-3 VIEWS RIGHT   XR Lumbar Spine 2-3 Views   XR HIP UNILAT W OR W/O PELVIS 2-3 VIEWS LEFT   MR Pelvis w/o contrast   MR Lumbar Spine w/o contrast   No orders of the defined types were placed in this encounter.     Procedures: No procedures performed   Clinical Data: No additional findings.  Objective: Vital Signs: There were no vitals taken for this visit.  Physical Exam:  Constitutional: Patient appears well-developed HEENT:  Head: Normocephalic Eyes:EOM are normal Neck: Normal range of motion Cardiovascular: Normal rate Pulmonary/chest: Effort normal Neurologic: Patient is alert Skin: Skin is warm Psychiatric: Patient has normal mood and affect  Ortho Exam: Ortho exam demonstrates full active and passive range of motion of both hips.  Not too much groin pain with internal/external rotation of either hip.  When he stands  he is having more pain but his pelvis is level when he stands on the right left foot.  DP pulse 2+ out of 4 bilaterally.  4+ out of 5 hip flexion strength on the left compared to the right.  Otherwise patient has symmetric ankle dorsiflexion plantarflexion quad and hamstring strength.  Abduction adduction strength also symmetric.  Mild pain with forward and lateral bending but no definite trochanteric tenderness is present.  No muscle atrophy in the left leg versus right leg.  Specialty Comments:  No specialty comments available.  Imaging: XR HIP UNILAT W OR W/O PELVIS 2-3 VIEWS RIGHT Result Date: 01/12/2024 AP pelvis lateral radiographs left hip reviewed.  No acute fracture.  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  No significant spurring is present.    PMFS History: Patient Active Problem List   Diagnosis Date Noted   Finger mass, right 01/21/2022   Hypertension    Hyperlipidemia    GERD (gastroesophageal reflux disease)    Diabetes mellitus without complication (HCC)    Cancer (HCC)    Past Medical History:  Diagnosis Date   Allergy    mild   Arthritis    hands    Cancer (HCC)    skin cancer - spot removed from neck   Diabetes mellitus without complication (HCC)    Diverticulosis    GERD (gastroesophageal reflux disease)    Hx of adenomatous colonic polyps    x 12  on 04-19-2018   Hyperlipidemia    Hypertension    IDA (iron deficiency anemia)    Internal hemorrhoids     Family History  Problem Relation Age of Onset   Hypertension Mother    Alcohol abuse Mother    Heart disease Mother    Hypertension Father    Alcohol abuse Father    Colon cancer Sister        in her 59s   Colon polyps Sister    Breast cancer Sister    Esophageal cancer Sister    Hypertension Sister    Heart disease Sister    Peripheral vascular disease Sister    Lung cancer Sister    Heart disease Sister    Coronary artery disease Sister    COPD Sister    Peptic Ulcer Sister    Anemia Sister    Skin cancer Brother    Heart disease Brother    Diabetes Brother    Stroke Brother    Hypertension Brother    Hyperlipidemia Brother    Cancer Brother        type unknown   Heart disease Brother    Hypertension Brother    Hyperlipidemia Brother    Diabetes Brother    Stroke Brother    Hypertension Brother    Hyperlipidemia Brother    Diabetes Brother    Stomach cancer Neg Hx    Rectal cancer Neg Hx     Past Surgical History:  Procedure Laterality Date   APPENDECTOMY     carpel tunnel Right    CERVICAL SPINE SURGERY     C3-C4 - plate   COLONOSCOPY     greater than 10 yrs  in [place]   ELBOW SURGERY Bilateral    elbow surgery x 2 - bilateral ulnar nerve   ESOPHAGOGASTRODUODENOSCOPY   02/2023   3 cm HH/gastritis/recall 3 mth   MULTIPLE TOOTH EXTRACTIONS     POLYPECTOMY     Social History   Occupational  History   Occupation: retired  Tobacco Use   Smoking status: Former    Current packs/day: 0.00    Average packs/day: 1 pack/day for 25.0 years (25.0 ttl pk-yrs)    Types: Cigarettes    Start date: 70    Quit date: 2014    Years since quitting: 11.3   Smokeless tobacco: Never  Vaping Use   Vaping status: Never Used  Substance and Sexual Activity   Alcohol use: Never   Drug use: Not Currently    Types: Marijuana    Comment: Last use March 22 2018   Sexual activity: Not on file

## 2024-01-17 ENCOUNTER — Ambulatory Visit
Admission: RE | Admit: 2024-01-17 | Discharge: 2024-01-17 | Disposition: A | Discharge status: Home / Self Care | Source: Ambulatory Visit | Attending: Orthopedic Surgery | Admitting: Orthopedic Surgery

## 2024-01-17 DIAGNOSIS — M79605 Pain in left leg: Secondary | ICD-10-CM

## 2024-01-31 ENCOUNTER — Ambulatory Visit: Admitting: Orthopedic Surgery

## 2024-01-31 ENCOUNTER — Encounter: Payer: Self-pay | Admitting: Orthopedic Surgery

## 2024-01-31 DIAGNOSIS — M25551 Pain in right hip: Secondary | ICD-10-CM | POA: Diagnosis not present

## 2024-01-31 DIAGNOSIS — M79605 Pain in left leg: Secondary | ICD-10-CM | POA: Diagnosis not present

## 2024-01-31 NOTE — Progress Notes (Signed)
 Office Visit Note   Patient: MARJORIE LUSSIER           Date of Birth: 17-Mar-1961           MRN: [id number] Visit Date: 01/31/2024 Requested by: Corrington, Kip A, MD [phone] [address] [place],  [place] [address] PCP: Chandler Combs, MD  Subjective: Chief Complaint  Patient presents with   Other    Review lumbar spine and pelvis MRI    HPI: CORDELLE DAHMEN is a 63 y.o. male who presents to the office reporting continued low back pain and left-sided leg pain.  Since he was last seen has had MRI of the lumbar spine as well as the pelvis.  MRI pelvis unremarkable.  MRI lumbar spine shows up to moderate left-sided L3 neuroforaminal stenosis which does appear increased from 2014 MRI scan..                ROS: All systems reviewed are negative as they relate to the chief complaint within the history of present illness.  Patient denies fevers or chills.  Assessment & Plan: Visit Diagnoses:  1. Pain of right hip   2. Pain in left leg     Plan: Impression is normal hip radiographs.  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  Plan at this time is referral to Dr. Daisey Dryer for lumbar spine ESI.  He will follow-up with us  as needed.  Follow-Up Instructions: No follow-ups on file.   Orders:  No orders of the defined types were placed in this encounter.  No orders of the defined types were placed in this encounter.     Procedures: No procedures performed   Clinical Data: No additional findings.  Objective: Vital Signs: There were no vitals taken for this visit.  Physical Exam:  Constitutional: Patient appears well-developed HEENT:  Head: Normocephalic Eyes:EOM are normal Neck: Normal range of motion Cardiovascular: Normal rate Pulmonary/chest: Effort normal Neurologic: Patient is alert Skin: Skin is warm Psychiatric: Patient has normal mood and affect  Ortho Exam: Ortho exam demonstrates no nerve root tension signs today.   Little bit of hip flexor weakness on the left compared to the right.  Ankle dorsiflexion plantarflexion quad hamstring strength is intact and symmetric.  No groin pain with internal/external rotation of the leg.  Specialty Comments:  No specialty comments available.  Imaging: No results found.   PMFS History: Patient Active Problem List   Diagnosis Date Noted   Finger mass, right 01/21/2022   Hypertension    Hyperlipidemia    GERD (gastroesophageal reflux disease)    Diabetes mellitus without complication (HCC)    Cancer (HCC)    Past Medical History:  Diagnosis Date   Allergy    mild   Arthritis    hands    Cancer (HCC)    skin cancer - spot removed from neck   Diabetes mellitus without complication (HCC)    Diverticulosis    GERD (gastroesophageal reflux disease)    Hx of adenomatous colonic polyps    x 12  on 04-19-2018   Hyperlipidemia    Hypertension    IDA (iron deficiency anemia)    Internal hemorrhoids     Family History  Problem Relation Age of Onset   Hypertension Mother    Alcohol abuse Mother    Heart disease Mother    Hypertension Father    Alcohol abuse Father    Colon cancer Sister  in her 30s   Colon polyps Sister    Breast cancer Sister    Esophageal cancer Sister    Hypertension Sister    Heart disease Sister    Peripheral vascular disease Sister    Lung cancer Sister    Heart disease Sister    Coronary artery disease Sister    COPD Sister    Peptic Ulcer Sister    Anemia Sister    Skin cancer Brother    Heart disease Brother    Diabetes Brother    Stroke Brother    Hypertension Brother    Hyperlipidemia Brother    Cancer Brother        type unknown   Heart disease Brother    Hypertension Brother    Hyperlipidemia Brother    Diabetes Brother    Stroke Brother    Hypertension Brother    Hyperlipidemia Brother    Diabetes Brother    Stomach cancer Neg Hx    Rectal cancer Neg Hx     Past Surgical History:  Procedure  Laterality Date   APPENDECTOMY     carpel tunnel Right    CERVICAL SPINE SURGERY     C3-C4 - plate   COLONOSCOPY     greater than 10 yrs  in [place]   ELBOW SURGERY Bilateral    elbow surgery x 2 - bilateral ulnar nerve   ESOPHAGOGASTRODUODENOSCOPY  02/2023   3 cm HH/gastritis/recall 3 mth   MULTIPLE TOOTH EXTRACTIONS     POLYPECTOMY     Social History   Occupational History   Occupation: retired  Tobacco Use   Smoking status: Former    Current packs/day: 0.00    Average packs/day: 1 pack/day for 25.0 years (25.0 ttl pk-yrs)    Types: Cigarettes    Start date: 14    Quit date: 2014    Years since quitting: 11.3   Smokeless tobacco: Never  Vaping Use   Vaping status: Never Used  Substance and Sexual Activity   Alcohol use: Never   Drug use: Not Currently    Types: Marijuana    Comment: Last use March 22 2018   Sexual activity: Not on file

## 2024-02-05 NOTE — Addendum Note (Signed)
 Addended by: Acey Ace on: 02/05/2024 08:18 AM   Modules accepted: Orders

## 2024-02-21 ENCOUNTER — Other Ambulatory Visit: Payer: Self-pay

## 2024-02-21 ENCOUNTER — Ambulatory Visit: Admitting: Physical Medicine and Rehabilitation

## 2024-02-21 VITALS — BP 137/87 | HR 64

## 2024-02-21 DIAGNOSIS — M5416 Radiculopathy, lumbar region: Secondary | ICD-10-CM | POA: Diagnosis not present

## 2024-02-21 MED ORDER — METHYLPREDNISOLONE ACETATE 40 MG/ML IJ SUSP
40.0000 mg | Freq: Once | INTRAMUSCULAR | Status: AC
  Administered 2024-02-21: 40 mg

## 2024-02-21 NOTE — Patient Instructions (Signed)

## 2024-02-21 NOTE — Progress Notes (Unsigned)
 Pain Scale   Average Pain 4 Patient advising he has mor weakness than pain in his lower back causing him to struggle when climbing stairs. Patient states the pain comes and goes        +Driver, -BT, -Dye Allergies.

## 2024-02-22 NOTE — Progress Notes (Signed)
 Steven Gates - 63 y.o. male MRN [id number]  Date of birth: 06-24-1961  Office Visit Note: Visit Date: 02/21/2024 PCP: Chandler Combs, MD Referred by: Chandler Combs, MD  Subjective: Chief Complaint  Patient presents with   Lower Back - Pain   HPI:  Steven Gates is a 63 y.o. male who comes in today at the request of Dr. Marykay Snipes for planned Left L3-4 Lumbar Transforaminal epidural steroid injection with fluoroscopic guidance.  The patient has failed conservative care including home exercise, medications, time and activity modification.  This injection will be diagnostic and hopefully therapeutic.  Please see requesting physician notes for further details and justification.   ROS Otherwise per HPI.  Assessment & Plan: Visit Diagnoses:    ICD-10-CM   1. Lumbar radiculopathy  M54.16 XR C-ARM NO REPORT    Epidural Steroid injection    methylPREDNISolone  acetate (DEPO-MEDROL ) injection 40 mg      Plan: No additional findings.   Meds & Orders:  Meds ordered this encounter  Medications   methylPREDNISolone  acetate (DEPO-MEDROL ) injection 40 mg    Orders Placed This Encounter  Procedures   XR C-ARM NO REPORT   Epidural Steroid injection    Follow-up: No follow-ups on file.   Procedures: No procedures performed  Lumbosacral Transforaminal Epidural Steroid Injection - Sub-Pedicular Approach with Fluoroscopic Guidance  [name]      Date of Birth: 03-22-1961 MRN: [id number] PCP: Chandler Combs, MD      Visit Date: 02/21/2024   Universal Protocol:    Date/Time: 02/21/2024  Consent Given By: the patient  Position: PRONE  Additional Comments: Vital signs were monitored before and after the procedure. Patient was prepped and draped in the usual sterile fashion. The correct patient, procedure, and site was verified.   Injection Procedure Details:   Procedure diagnoses: Lumbar radiculopathy [M54.16]    Meds Administered:  Meds ordered  this encounter  Medications   methylPREDNISolone  acetate (DEPO-MEDROL ) injection 40 mg    Laterality: Left  Location/Site: L3  Needle:5.0 in., 22 ga.  Short bevel or Quincke spinal needle  Needle Placement: Transforaminal  Findings:    -Comments: Excellent flow of contrast along the nerve, nerve root and into the epidural space.  Procedure Details: After squaring off the end-plates to get a true AP view, the C-arm was positioned so that an oblique view of the foramen as noted above was visualized. The target area is just inferior to the "nose of the scotty dog" or sub pedicular. The soft tissues overlying this structure were infiltrated with 2-3 ml. of 1% Lidocaine  without Epinephrine.  The spinal needle was inserted toward the target using a "trajectory" view along the fluoroscope beam.  Under AP and lateral visualization, the needle was advanced so it did not puncture dura and was located close the 6 O'Clock position of the pedical in AP tracterory. Biplanar projections were used to confirm position. Aspiration was confirmed to be negative for CSF and/or blood. A 1-2 ml. volume of Isovue -250 was injected and flow of contrast was noted at each level. Radiographs were obtained for documentation purposes.   After attaining the desired flow of contrast documented above, a 0.5 to 1.0 ml test dose of 0.25% Marcaine  was injected into each respective transforaminal space.  The patient was observed for 90 seconds post injection.  After no sensory deficits were reported, and normal lower extremity motor function was noted,   the above injectate was administered so  that equal amounts of the injectate were placed at each foramen (level) into the transforaminal epidural space.   Additional Comments:  The patient tolerated the procedure well Dressing: 2 x 2 sterile gauze and Band-Aid    Post-procedure details: Patient was observed during the procedure. Post-procedure instructions were  reviewed.  Patient left the clinic in stable condition.    Clinical History: CLINICAL DATA:  63 year old male with pain radiating to the left hip and leg with weakness.   EXAM: MRI LUMBAR SPINE WITHOUT CONTRAST   TECHNIQUE: Multiplanar, multisequence MR imaging of the lumbar spine was performed. No intravenous contrast was administered.   COMPARISON:  CT lumbar myelogram 02/12/2013.  Lumbar MRI 01/26/2013.   FINDINGS: Segmentation:  Normal on the comparison CT.   Alignment: Chronic straightening of lumbar lordosis is stable. Subtle anterolisthesis of L5 on S1 has not significantly changed.   Vertebrae: Normal background bone marrow signal. Faint and degenerative appearing marrow edema of the L4 inferior and S1 superior endplates on the left (series 104, image 12). Maintained vertebral body height. Intact visible sacrum and SI joints. No other acute osseous abnormality.   Conus medullaris and cauda equina: Conus extends to the L1 level. No lower spinal cord or conus signal abnormality. Spinal canal. Normal cauda equina nerve roots. Capacious   Paraspinal and other soft tissues: Negative.   Disc levels:   T11-T12 and   T12-L1:  Negative.   L1-L2:  Minimal disc desiccation and disc bulging.  No stenosis.   L2-L3:  Negative.   L3-L4: Minimal disc desiccation. Mild circumferential disc bulge, mostly anterior. Mild facet hypertrophy. No spinal or lateral recess stenosis. Asymmetric mild to moderate left and mild right L3 neural foraminal stenosis related to broad-based disc and endplate spurring (series 102, image 13 on the left) has mildly progressed.   L4-L5: Similar minor disc desiccation, circumferential disc bulge and endplate spurring. Mild facet and ligament flavum hypertrophy. No spinal or lateral recess stenosis. Borderline to mild bilateral L4 neural foraminal stenosis has not significantly changed since 03/20/13.   L5-S1: More pronounced disc desiccation.  Mild circumferential disc bulge and endplate spurring. Mild facet and ligament flavum hypertrophy. No spinal or lateral recess stenosis. Only borderline to mild L5 neural foraminal stenosis with minimal progression since 03-20-2013.   IMPRESSION: 1. 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 Capacious spinal canal with no spinal or lateral recess stenosis.   2. Up to moderate left L3 neural foraminal stenosis related to disc and endplate degeneration which does appear increased from a 2014 MRI. Other mild lumbar neural foraminal stenosis has not significantly changed.   3. Mild degenerative marrow edema at the L4 and S1 endplates asymmetric to the left.     Electronically Signed   By: Marlise Simpers M.D.   On: 01/31/2024 09:42     Objective:  VS:  HT:    WT:   BMI:     BP:137/87  HR:64bpm  TEMP: ( )  RESP:  Physical Exam Vitals and nursing note reviewed.  Constitutional:      General: He is not in acute distress.    Appearance: Normal appearance. He is not ill-appearing.  HENT:     Head: Normocephalic and atraumatic.     Right Ear: External ear normal.     Left Ear: External ear normal.     Nose: No congestion.  Eyes:     Extraocular Movements: Extraocular movements intact.  Cardiovascular:     Rate and Rhythm: Normal rate.  Pulses: Normal pulses.  Pulmonary:     Effort: Pulmonary effort is normal. No respiratory distress.  Abdominal:     General: There is no distension.     Palpations: Abdomen is soft.  Musculoskeletal:        General: No tenderness or signs of injury.     Cervical back: Neck supple.     Right lower leg: No edema.     Left lower leg: No edema.     Comments: Patient has good distal strength without clonus.  Skin:    Findings: No erythema or rash.  Neurological:     General: No focal deficit present.     Mental Status: He is alert and oriented to person, place, and time.     Sensory: No sensory deficit.     Motor: No weakness or abnormal muscle tone.      Coordination: Coordination normal.  Psychiatric:        Mood and Affect: Mood normal.        Behavior: Behavior normal.      Imaging: XR C-ARM NO REPORT Result Date: 02/21/2024 Please see Notes tab for imaging impression.

## 2024-02-22 NOTE — Procedures (Signed)
 Lumbosacral Transforaminal Epidural Steroid Injection - Sub-Pedicular Approach with Fluoroscopic Guidance  [name]      Date of Birth: 1961-04-15 MRN: [id number] PCP: Chandler Combs, MD      Visit Date: 02/21/2024   Universal Protocol:    Date/Time: 02/21/2024  Consent Given By: the patient  Position: PRONE  Additional Comments: Vital signs were monitored before and after the procedure. Patient was prepped and draped in the usual sterile fashion. The correct patient, procedure, and site was verified.   Injection Procedure Details:   Procedure diagnoses: Lumbar radiculopathy [M54.16]    Meds Administered:  Meds ordered this encounter  Medications   methylPREDNISolone  acetate (DEPO-MEDROL ) injection 40 mg    Laterality: Left  Location/Site: L3  Needle:5.0 in., 22 ga.  Short bevel or Quincke spinal needle  Needle Placement: Transforaminal  Findings:    -Comments: Excellent flow of contrast along the nerve, nerve root and into the epidural space.  Procedure Details: After squaring off the end-plates to get a true AP view, the C-arm was positioned so that an oblique view of the foramen as noted above was visualized. The target area is just inferior to the "nose of the scotty dog" or sub pedicular. The soft tissues overlying this structure were infiltrated with 2-3 ml. of 1% Lidocaine  without Epinephrine.  The spinal needle was inserted toward the target using a "trajectory" view along the fluoroscope beam.  Under AP and lateral visualization, the needle was advanced so it did not puncture dura and was located close the 6 O'Clock position of the pedical in AP tracterory. Biplanar projections were used to confirm position. Aspiration was confirmed to be negative for CSF and/or blood. A 1-2 ml. volume of Isovue -250 was injected and flow of contrast was noted at each level. Radiographs were obtained for documentation purposes.   After attaining the desired flow  of contrast documented above, a 0.5 to 1.0 ml test dose of 0.25% Marcaine  was injected into each respective transforaminal space.  The patient was observed for 90 seconds post injection.  After no sensory deficits were reported, and normal lower extremity motor function was noted,   the above injectate was administered so that equal amounts of the injectate were placed at each foramen (level) into the transforaminal epidural space.   Additional Comments:  The patient tolerated the procedure well Dressing: 2 x 2 sterile gauze and Band-Aid    Post-procedure details: Patient was observed during the procedure. Post-procedure instructions were reviewed.  Patient left the clinic in stable condition.

## 2024-04-03 ENCOUNTER — Encounter: Payer: Self-pay | Admitting: Orthopedic Surgery

## 2024-04-03 ENCOUNTER — Ambulatory Visit (INDEPENDENT_AMBULATORY_CARE_PROVIDER_SITE_OTHER): Admitting: Orthopedic Surgery

## 2024-04-03 DIAGNOSIS — M79605 Pain in left leg: Secondary | ICD-10-CM

## 2024-04-03 NOTE — Progress Notes (Signed)
 Office Visit Note   [name]           Date of Birth: 1961-06-03           MRN: [id number] Visit Date: 04/03/2024 Requested by: [place], Kip A, MD [phone] [place],  [place] [address] PCP: Bobbette Coye LABOR, MD  Subjective: Chief Complaint  Patient presents with   Left Leg - Follow-up, Weakness    HPI: Steven Gates is a 63 y.o. male who presents to the office reporting continued left leg pain and tingling.  He received 0% relief from his epidural steroid injection.  His brother has had history of vascular problems.  He does have an MRI scan which shows rather severe foraminal stenosis on the left-hand side from the lumbar spine.  He states he is having weakness and tingling in the leg down to the foot.  He wonders if this is a blood flow issue..                ROS: All systems reviewed are negative as they relate to the chief complaint within the history of present illness.  Patient denies fevers or chills.  Assessment & Plan: Visit Diagnoses:  1. Pain in left leg     Plan: Impression is diminished palpable pulses on the left with palpable pulses on the right.  I am able to Doppler signal on the left-hand side.  However because of that cytocide difference ABI indicated bilateral lower extremities.  May need referral to vascular surgery after that.  If pulses are okay then I think this is likely resulting from the rather severe foraminal stenosis he has on that left-hand side around L3-4.  Follow-Up Instructions: No follow-ups on file.   Orders:  Orders Placed This Encounter  Procedures   VAS US  ABI WITH/WO TBI   No orders of the defined types were placed in this encounter.     Procedures: No procedures performed   Clinical Data: No additional findings.  Objective: Vital Signs: There were no vitals taken for this visit.  Physical Exam:  Constitutional: Patient appears well-developed HEENT:  Head: Normocephalic Eyes:EOM are normal Neck:  Normal range of motion Cardiovascular: Normal rate Pulmonary/chest: Effort normal Neurologic: Patient is alert Skin: Skin is warm Psychiatric: Patient has normal mood and affect  Ortho Exam: Ortho exam demonstrates palpable pulses on the left DP and PT with palpable pulses on the right which are fairly easy to palpate.  He has very good ankle dorsiflexion plantarflexion quad hamstring strength with no nerve root tension signs today and no muscle atrophy in the leg.  Specialty Comments:  CLINICAL DATA:  63 year old male with pain radiating to the left hip and leg with weakness.   EXAM: MRI LUMBAR SPINE WITHOUT CONTRAST   TECHNIQUE: Multiplanar, multisequence MR imaging of the lumbar spine was performed. No intravenous contrast was administered.   COMPARISON:  CT lumbar myelogram 02/12/2013.  Lumbar MRI 01/26/2013.   FINDINGS: Segmentation:  Normal on the comparison CT.   Alignment: Chronic straightening of lumbar lordosis is stable. Subtle anterolisthesis of L5 on S1 has not significantly changed.   Vertebrae: Normal background bone marrow signal. Faint and degenerative appearing marrow edema of the L4 inferior and S1 superior endplates on the left (series 104, image 12). Maintained vertebral body height. Intact visible sacrum and SI joints. No other acute osseous abnormality.   Conus medullaris and cauda equina: Conus extends to the L1 level. No lower spinal cord  or conus signal abnormality. Spinal canal. Normal cauda equina nerve roots. Capacious   Paraspinal and other soft tissues: Negative.   Disc levels:   T11-T12 and   T12-L1:  Negative.   L1-L2:  Minimal disc desiccation and disc bulging.  No stenosis.   L2-L3:  Negative.   L3-L4: Minimal disc desiccation. Mild circumferential disc bulge, mostly anterior. Mild facet hypertrophy. No spinal or lateral recess stenosis. Asymmetric mild to moderate left and mild right L3 neural foraminal stenosis related to  broad-based disc and endplate spurring (series 102, image 13 on the left) has mildly progressed.   L4-L5: Similar minor disc desiccation, circumferential disc bulge and endplate spurring. Mild facet and ligament flavum hypertrophy. No spinal or lateral recess stenosis. Borderline to mild bilateral L4 neural foraminal stenosis has not significantly changed since 2013/05/17.   L5-S1: More pronounced disc desiccation. Mild circumferential disc bulge and endplate spurring. Mild facet and ligament flavum hypertrophy. No spinal or lateral recess stenosis. Only borderline to mild L5 neural foraminal stenosis with minimal progression since May 17, 2013.   IMPRESSION: 1. 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 Capacious spinal canal with no spinal or lateral recess stenosis.   2. Up to moderate left L3 neural foraminal stenosis related to disc and endplate degeneration which does appear increased from a 2014 MRI. Other mild lumbar neural foraminal stenosis has not significantly changed.   3. Mild degenerative marrow edema at the L4 and S1 endplates asymmetric to the left.     Electronically Signed   By: VEAR Hurst M.D.   On: 01/31/2024 09:42  Imaging: No results found.   PMFS History: Patient Active Problem List   Diagnosis Date Noted   Finger mass, right 01/21/2022   Hypertension    Hyperlipidemia    GERD (gastroesophageal reflux disease)    Diabetes mellitus without complication (HCC)    Cancer (HCC)    Past Medical History:  Diagnosis Date   Allergy    mild   Arthritis    hands    Cancer (HCC)    skin cancer - spot removed from neck   Diabetes mellitus without complication (HCC)    Diverticulosis    GERD (gastroesophageal reflux disease)    Hx of adenomatous colonic polyps    x 12  on 04-19-2018   Hyperlipidemia    Hypertension    IDA (iron deficiency anemia)    Internal hemorrhoids     Family History  Problem Relation Age of Onset   Hypertension Mother    Alcohol abuse  Mother    Heart disease Mother    Hypertension Father    Alcohol abuse Father    Colon cancer Sister        in her 70s   Colon polyps Sister    Breast cancer Sister    Esophageal cancer Sister    Hypertension Sister    Heart disease Sister    Peripheral vascular disease Sister    Lung cancer Sister    Heart disease Sister    Coronary artery disease Sister    COPD Sister    Peptic Ulcer Sister    Anemia Sister    Skin cancer Brother    Heart disease Brother    Diabetes Brother    Stroke Brother    Hypertension Brother    Hyperlipidemia Brother    Cancer Brother        type unknown   Heart disease Brother    Hypertension Brother    Hyperlipidemia Brother  Diabetes Brother    Stroke Brother    Hypertension Brother    Hyperlipidemia Brother    Diabetes Brother    Stomach cancer Neg Hx    Rectal cancer Neg Hx     Past Surgical History:  Procedure Laterality Date   APPENDECTOMY     carpel tunnel Right    CERVICAL SPINE SURGERY     C3-C4 - plate   COLONOSCOPY     greater than 10 yrs  in [place]   ELBOW SURGERY Bilateral    elbow surgery x 2 - bilateral ulnar nerve   ESOPHAGOGASTRODUODENOSCOPY  02/2023   3 cm HH/gastritis/recall 3 mth   MULTIPLE TOOTH EXTRACTIONS     POLYPECTOMY     Social History   Occupational History   Occupation: retired  Tobacco Use   Smoking status: Former    Current packs/day: 0.00    Average packs/day: 1 pack/day for 25.0 years (25.0 ttl pk-yrs)    Types: Cigarettes    Start date: 13    Quit date: 2014    Years since quitting: 11.5   Smokeless tobacco: Never  Vaping Use   Vaping status: Never Used  Substance and Sexual Activity   Alcohol use: Never   Drug use: Not Currently    Types: Marijuana    Comment: Last use March 22 2018   Sexual activity: Not on file

## 2024-04-11 ENCOUNTER — Ambulatory Visit (HOSPITAL_COMMUNITY)
Admission: RE | Admit: 2024-04-11 | Discharge: 2024-04-11 | Disposition: A | Discharge status: Home / Self Care | Source: Ambulatory Visit | Attending: Orthopedic Surgery | Admitting: Orthopedic Surgery

## 2024-04-11 DIAGNOSIS — M79605 Pain in left leg: Secondary | ICD-10-CM | POA: Insufficient documentation

## 2024-04-16 ENCOUNTER — Ambulatory Visit: Payer: Self-pay | Admitting: Orthopedic Surgery

## 2024-04-16 NOTE — Progress Notes (Signed)
 I saw a Arvle's wife the other day.  Can you get him set up to see vascular and also try another injection in his back with Dr. Eldonna.  Thanks

## 2024-04-17 ENCOUNTER — Other Ambulatory Visit: Payer: Self-pay

## 2024-04-17 DIAGNOSIS — M5416 Radiculopathy, lumbar region: Secondary | ICD-10-CM

## 2024-04-17 DIAGNOSIS — M79605 Pain in left leg: Secondary | ICD-10-CM

## 2024-04-29 ENCOUNTER — Encounter: Payer: Self-pay | Admitting: Surgery

## 2024-04-29 ENCOUNTER — Ambulatory Visit: Attending: Surgery | Admitting: Surgery

## 2024-04-29 VITALS — BP 138/84 | HR 58 | Temp 98.4°F | Resp 18 | Ht 65.0 in | Wt 155.7 lb

## 2024-04-29 DIAGNOSIS — I739 Peripheral vascular disease, unspecified: Secondary | ICD-10-CM | POA: Diagnosis not present

## 2024-04-29 NOTE — Progress Notes (Signed)
 [facility] [place]  Patient name: Steven Gates MRN: [id number] DOB: 15-Jul-1961 Sex: male   REQUESTING PROVIDER:    Dr. Addie   REASON FOR CONSULT:    PAD  HISTORY OF PRESENT ILLNESS:   Steven Gates is a 63 y.o. male, who is referred for evaluation of peripheral vascular disease.  The patient states that for years he has been having issues with left leg weakness.  He describes it as a pain going down from his hip all the way down to his foot.  The foot will become tingling and numb.  This does happen with activity.  He denies any claudication type symptoms.  He does not have rest pain or nonhealing wounds.  There is a significant family history of cardiovascular disease.  He is a former smoker but has quit.  Patient suffers from diabetes.  He is medically managed for hypertension.  He takes a baby aspirin.  He has undergone several injections of his back without significant improvement  PAST MEDICAL HISTORY    Past Medical History:  Diagnosis Date   Allergy    mild   Arthritis    hands    Cancer (HCC)    skin cancer - spot removed from neck   Diabetes mellitus without complication (HCC)    Diverticulosis    GERD (gastroesophageal reflux disease)    Hx of adenomatous colonic polyps    x 12  on 04-19-2018   Hyperlipidemia    Hypertension    IDA (iron deficiency anemia)    Internal hemorrhoids      FAMILY HISTORY   Family History  Problem Relation Age of Onset   Hypertension Mother    Alcohol abuse Mother    Heart disease Mother    Hypertension Father    Alcohol abuse Father    Colon cancer Sister        in her 3s   Colon polyps Sister    Breast cancer Sister    Esophageal cancer Sister    Hypertension Sister    Heart disease Sister    Peripheral vascular disease Sister    Lung cancer Sister    Heart disease Sister    Coronary artery disease Sister    COPD Sister    Peptic Ulcer Sister    Anemia  Sister    Skin cancer Brother    Heart disease Brother    Diabetes Brother    Stroke Brother    Hypertension Brother    Hyperlipidemia Brother    Cancer Brother        type unknown   Heart disease Brother    Hypertension Brother    Hyperlipidemia Brother    Diabetes Brother    Stroke Brother    Hypertension Brother    Hyperlipidemia Brother    Diabetes Brother    Stomach cancer Neg Hx    Rectal cancer Neg Hx     SOCIAL HISTORY:   Social History   Socioeconomic History   Marital status: Married    Spouse name: Not on file   Number of children: 1   Years of education: Not on file   Highest education level: Not on file  Occupational History   Occupation: retired  Tobacco Use   Smoking status: Former    Current packs/day: 0.00    Average packs/day: 1 pack/day for 25.0 years (25.0 ttl pk-yrs)    Types: Cigarettes    Start date: 1989    Quit  date: 2014    Years since quitting: 11.6   Smokeless tobacco: Never  Vaping Use   Vaping status: Never Used  Substance and Sexual Activity   Alcohol use: Never   Drug use: Not Currently    Types: Marijuana    Comment: Last use March 22 2018   Sexual activity: Not on file  Other Topics Concern   Not on file  Social History Narrative   Not on file   Social Drivers of Health   Financial Resource Strain: Low Risk  (03/04/2024)   Received from [facility]   Overall Financial Resource Strain (CARDIA)    Difficulty of Paying Living Expenses: Not hard at all  Food Insecurity: No Food Insecurity (03/04/2024)   Received from [facility]   Hunger Vital Sign    Within the past 12 months, you worried that your food would run out before you got the money to buy more.: Never true    Within the past 12 months, the food you bought just didn't last and you didn't have money to get more.: Never true  Transportation Needs: No Transportation Needs (03/04/2024)   Received from [facility] - Transportation    Lack of  Transportation (Medical): No    Lack of Transportation (Non-Medical): No  Physical Activity: Unknown (06/19/2023)   Received from [facility]   Exercise Vital Sign    On average, how many days per week do you engage in moderate to strenuous exercise (like a brisk walk)?: 7 days    On average, how many minutes do you engage in exercise at this level?: Patient declined  Stress: No Stress Concern Present (06/19/2023)   Received from [facility] - Occupational Stress Questionnaire    Feeling of Stress : Not at all  Social Connections: Socially Integrated (06/19/2023)   Received from [facility]   Social Network    How would you rate your social network (family, work, friends)?: Good participation with social networks  Intimate Partner Violence: Not At Risk (06/19/2023)   Received from [facility]   HITS    Over the last 12 months how often did your partner physically hurt you?: Never    Over the last 12 months how often did your partner insult you or talk down to you?: Never    Over the last 12 months how often did your partner threaten you with physical harm?: Never    Over the last 12 months how often did your partner scream or curse at you?: Never    ALLERGIES:    Allergies  Allergen Reactions   Prednisone Other (See Comments)    Irritable    CURRENT MEDICATIONS:    Current Outpatient Medications  Medication Sig Dispense Refill   aspirin EC 81 MG tablet Take 81 mg by mouth daily.     esomeprazole  (NEXIUM ) 40 MG capsule Take 1 capsule (40 mg total) by mouth daily. 90 capsule 3   ferrous gluconate (FERGON) 324 MG tablet Take 324 mg by mouth daily.     hydrochlorothiazide (HYDRODIURIL) 25 MG tablet Take 25 mg by mouth daily.     irbesartan (AVAPRO) 150 MG tablet Take 150 mg by mouth daily.     metFORMIN (GLUCOPHAGE) 500 MG tablet Take 1,000 mg by mouth 2 (two) times daily with a meal.     metoprolol succinate (TOPROL-XL) 100 MG 24  hr tablet Take 100 mg by mouth daily. Take with or immediately  following a meal.     simvastatin (ZOCOR) 40 MG tablet Take 40 mg by mouth daily.     No current facility-administered medications for this visit.    REVIEW OF SYSTEMS:   [X]  denotes positive finding, [ ]  denotes negative finding Cardiac  Comments:  Chest pain or chest pressure:    Shortness of breath upon exertion:    Short of breath when lying flat:    Irregular heart rhythm:        Vascular    Pain in calf, thigh, or hip brought on by ambulation:    Pain in feet at night that wakes you up from your sleep:     Blood clot in your veins:    Leg swelling:         Pulmonary    Oxygen at home:    Productive cough:     Wheezing:         Neurologic    Sudden weakness in arms or legs:     Sudden numbness in arms or legs:     Sudden onset of difficulty speaking or slurred speech:    Temporary loss of vision in one eye:     Problems with dizziness:         Gastrointestinal    Blood in stool:      Vomited blood:         Genitourinary    Burning when urinating:     Blood in urine:        Psychiatric    Major depression:         Hematologic    Bleeding problems:    Problems with blood clotting too easily:        Skin    Rashes or ulcers:        Constitutional    Fever or chills:     PHYSICAL EXAM:   There were no vitals filed for this visit.  GENERAL: The patient is a well-nourished male, in no acute distress. The vital signs are documented above. CARDIAC: There is a regular rate and rhythm.  VASCULAR: Palpable posterior tibial pulses bilaterally PULMONARY: Nonlabored respirations ABDOMEN: Soft and non-tender with normal pitched bowel sounds.  MUSCULOSKELETAL: There are no major deformities or cyanosis. NEUROLOGIC: No focal weakness or paresthesias are detected. SKIN: There are no ulcers or rashes noted. PSYCHIATRIC: The patient has a normal affect.  STUDIES:   I have reviewed the  following: ABI Findings:  +---------+------------------+-----+---------+--------+  Right   Rt Pressure (mmHg)IndexWaveform Comment   +---------+------------------+-----+---------+--------+  Brachial 141                                       +---------+------------------+-----+---------+--------+  PTA     170               1.16 triphasic          +---------+------------------+-----+---------+--------+  DP      178               1.22 triphasic          +---------+------------------+-----+---------+--------+  [place]               0.94 Normal             +---------+------------------+-----+---------+--------+   +---------+------------------+-----+---------+-------+  Left    Lt Pressure (mmHg)IndexWaveform Comment  +---------+------------------+-----+---------+-------+  Brachial 146                                      +---------+------------------+-----+---------+-------+  PTA     146               1.00 triphasic         +---------+------------------+-----+---------+-------+  DP      142               0.97 biphasic          +---------+------------------+-----+---------+-------+  Great Toe98                0.67 Abnormal          +---------+------------------+-----+---------+-------+   ASSESSMENT and PLAN   I discussed with the patient that he has palpable pulses with triphasic waveforms bilaterally.  I do not think that his symptoms are consistent with a vascular etiology and I have encouraged him to pursue further evaluation of his lower back.  He does have a significant family history for cardiovascular disease.  A lot of this is probably relating to their history of smoking.  However I do think it is worthwhile pursuing cardiology evaluation.  I have made that referral   Malvina Serene CLORE, MD, FACS [facility] [phone] Pager [phone]

## 2024-05-01 ENCOUNTER — Ambulatory Visit: Admitting: Orthopedic Surgery

## 2024-05-13 ENCOUNTER — Other Ambulatory Visit: Payer: Self-pay

## 2024-05-13 ENCOUNTER — Ambulatory Visit (INDEPENDENT_AMBULATORY_CARE_PROVIDER_SITE_OTHER): Admitting: Physical Medicine and Rehabilitation

## 2024-05-13 ENCOUNTER — Encounter: Payer: Self-pay | Admitting: Physical Medicine and Rehabilitation

## 2024-05-13 VITALS — BP 145/75 | HR 88

## 2024-05-13 DIAGNOSIS — M79605 Pain in left leg: Secondary | ICD-10-CM | POA: Diagnosis not present

## 2024-05-13 DIAGNOSIS — R29898 Other symptoms and signs involving the musculoskeletal system: Secondary | ICD-10-CM

## 2024-05-13 DIAGNOSIS — M5416 Radiculopathy, lumbar region: Secondary | ICD-10-CM | POA: Diagnosis not present

## 2024-05-13 MED ORDER — METHYLPREDNISOLONE ACETATE 40 MG/ML IJ SUSP
40.0000 mg | Freq: Once | INTRAMUSCULAR | Status: AC
  Administered 2024-05-13: 40 mg

## 2024-05-13 NOTE — Progress Notes (Signed)
 Steven Gates - 63 y.o. male MRN [id number]  Date of birth: 1961-09-06  Office Visit Note: Visit Date: 05/13/2024 PCP: Bobbette Coye LABOR, MD Referred by: Bobbette Coye LABOR, MD  Subjective: Chief Complaint  Patient presents with   Lower Back - Pain   HPI:  Steven Gates is a 63 y.o. male who comes in today for evaluation and management at the request of Dr. JUDITHANN Glendia Hutchinson for continued severe left whole leg pain numbness and tingling to the foot.  His main complaint is left lateral deep hip pain.  Pain is really all the time but can be worse with sitting with legs straightened out and somewhat of a slump type test.  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  He has no right-sided complaints.  Prior injection at L3 offered no relief.  This was a transforaminal approach for moderate foraminal narrowing.  His MRI does not show any high-grade stenosis or nerve compression.  This was reviewed below.  He has not had any imaging of his pelvis.  He does have a history of diabetes.  His symptoms again are nondermatomal really all the way down the leg to the foot.  No real weakness or foot drop although he can report dragging the legs at times.  He has tried and failed all manner of conservative care with medication management excetra.  He does have a follow-up appointment with Dr. Hutchinson coming up.  Since we seen him he has had vascular workup which was negative.    Review of Systems  Musculoskeletal:  Positive for back pain and joint pain.  Neurological:  Positive for tingling and focal weakness.  All other systems reviewed and are negative.  Otherwise per HPI.  Assessment & Plan: Visit Diagnoses:    ICD-10-CM   1. Lumbar radiculopathy  M54.16 XR C-ARM NO REPORT    Epidural Steroid injection    methylPREDNISolone  acetate (DEPO-MEDROL ) injection 40 mg    2. Pain in left leg  M79.605     3. Left leg weakness  R29.898       Plan:  Findings:  Continued severe left lateral hip pain with his deep aching pain and that is most severe problem.  He gets referral pattern when it is bad down the leg in a nondermatomal fashion in the whole leg to the whole foot with numbness and tingling.  No focal weakness but dragging his leg.  This does not really coincide with his lumbar spine MRI per se.  I do think it would be wise to repeat the L3 transforaminal injection just make it a perfect injection to see if he gets any relief at all diagnostically.  But given the normal vascular study and the minimal changes on the MRI and if he did not get relief with this injection 1 suggestion would be possible MRI of the pelvis.  He could regroup with the physical therapist or chiropractor looking at piriformis type syndrome and gluteus medius type pain.    Meds & Orders:  Meds ordered this encounter  Medications   methylPREDNISolone  acetate (DEPO-MEDROL ) injection 40 mg    Orders Placed This Encounter  Procedures   XR C-ARM NO REPORT   Epidural Steroid injection    Follow-up: Return for visit to requesting provider as needed.   Procedures: No procedures performed      Clinical History: CLINICAL DATA:  63 year old male  with pain radiating to the left hip and leg with weakness.   EXAM: MRI LUMBAR SPINE WITHOUT CONTRAST   TECHNIQUE: Multiplanar, multisequence MR imaging of the lumbar spine was performed. No intravenous contrast was administered.   COMPARISON:  CT lumbar myelogram 02/12/2013.  Lumbar MRI 01/26/2013.   FINDINGS: Segmentation:  Normal on the comparison CT.   Alignment: Chronic straightening of lumbar lordosis is stable. Subtle anterolisthesis of L5 on S1 has not significantly changed.   Vertebrae: Normal background bone marrow signal. Faint and degenerative appearing marrow edema of the L4 inferior and S1 superior endplates on the left (series 104, image 12). Maintained vertebral body height. Intact visible sacrum  and SI joints. No other acute osseous abnormality.   Conus medullaris and cauda equina: Conus extends to the L1 level. No lower spinal cord or conus signal abnormality. Spinal canal. Normal cauda equina nerve roots. Capacious   Paraspinal and other soft tissues: Negative.   Disc levels:   T11-T12 and   T12-L1:  Negative.   L1-L2:  Minimal disc desiccation and disc bulging.  No stenosis.   L2-L3:  Negative.   L3-L4: Minimal disc desiccation. Mild circumferential disc bulge, mostly anterior. Mild facet hypertrophy. No spinal or lateral recess stenosis. Asymmetric mild to moderate left and mild right L3 neural foraminal stenosis related to broad-based disc and endplate spurring (series 102, image 13 on the left) has mildly progressed.   L4-L5: Similar minor disc desiccation, circumferential disc bulge and endplate spurring. Mild facet and ligament flavum hypertrophy. No spinal or lateral recess stenosis. Borderline to mild bilateral L4 neural foraminal stenosis has not significantly changed since May 24, 2013.   L5-S1: More pronounced disc desiccation. Mild circumferential disc bulge and endplate spurring. Mild facet and ligament flavum hypertrophy. No spinal or lateral recess stenosis. Only borderline to mild L5 neural foraminal stenosis with minimal progression since 2013-05-24.   IMPRESSION: 1. 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 Capacious spinal canal with no spinal or lateral recess stenosis.   2. Up to moderate left L3 neural foraminal stenosis related to disc and endplate degeneration which does appear increased from a 2014 MRI. Other mild lumbar neural foraminal stenosis has not significantly changed.   3. Mild degenerative marrow edema at the L4 and S1 endplates asymmetric to the left.     Electronically Signed   By: VEAR Hurst M.D.   On: 01/31/2024 09:42     Objective:  VS:  HT:    WT:   BMI:     BP:(!) 145/75  HR:88bpm  TEMP: ( )  RESP:  Physical Exam Vitals  and nursing note reviewed.  Constitutional:      General: He is not in acute distress.    Appearance: Normal appearance. He is well-developed. He is not ill-appearing.  HENT:     Head: Normocephalic and atraumatic.     Right Ear: External ear normal.     Left Ear: External ear normal.     Nose: No congestion.  Eyes:     Extraocular Movements: Extraocular movements intact.     Conjunctiva/sclera: Conjunctivae normal.     Pupils: Pupils are equal, round, and reactive to light.  Cardiovascular:     Rate and Rhythm: Normal rate.     Pulses: Normal pulses.     Heart sounds: Normal heart sounds.  Pulmonary:     Effort: Pulmonary effort is normal. No respiratory distress.  Abdominal:     General: There is no distension.     Palpations: Abdomen  is soft.  Musculoskeletal:        General: No tenderness or signs of injury.     Cervical back: Normal range of motion and neck supple. No rigidity.     Right lower leg: No edema.     Left lower leg: No edema.     Comments: Patient has good distal strength without clonus.  Examination of the left hip shows no pain with hip rotation.  He does have tenderness over the greater trochanter.  He has good strength in the lower extremities.  Skin:    General: Skin is warm and dry.     Findings: No erythema or rash.  Neurological:     General: No focal deficit present.     Mental Status: He is alert and oriented to person, place, and time.     Sensory: No sensory deficit.     Motor: No weakness or abnormal muscle tone.     Coordination: Coordination normal.     Gait: Gait normal.  Psychiatric:        Mood and Affect: Mood normal.        Behavior: Behavior normal.      Imaging: No results found.

## 2024-05-13 NOTE — Progress Notes (Signed)
 Pain Scale   Average Pain 8 Patient advising he has lower back pain radiating to his left hip and  it causing left leg numbness. Patient advising he only experiences this pain when he over works the area.          +Driver, -BT, -Dye Allergies.

## 2024-05-21 ENCOUNTER — Encounter: Payer: Self-pay | Admitting: Orthopedic Surgery

## 2024-05-21 NOTE — Telephone Encounter (Signed)
 Sure just to be sure then it is [place] as wesuspected all along

## 2024-05-21 NOTE — Telephone Encounter (Signed)
 Hi pls order mri pelvis for left hip occult oa and cx appt for tomorrow but f/u 3 wks thx

## 2024-05-22 ENCOUNTER — Ambulatory Visit: Admitting: Orthopedic Surgery

## 2024-05-22 ENCOUNTER — Other Ambulatory Visit: Payer: Self-pay

## 2024-05-22 DIAGNOSIS — M5416 Radiculopathy, lumbar region: Secondary | ICD-10-CM

## 2024-05-22 DIAGNOSIS — M79605 Pain in left leg: Secondary | ICD-10-CM

## 2024-05-22 NOTE — Telephone Encounter (Signed)
 Order for scan entered.

## 2024-05-24 ENCOUNTER — Encounter: Admitting: Vascular Surgery

## 2024-05-29 ENCOUNTER — Encounter: Payer: Self-pay | Admitting: Orthopedic Surgery

## 2024-05-29 NOTE — Telephone Encounter (Signed)
 I called and spoke with pt and told him this insurance does not require authorization the [employer] site states  this pt is not enrolled in [employer], so no referral is required for the requested services.

## 2024-06-02 ENCOUNTER — Ambulatory Visit (HOSPITAL_BASED_OUTPATIENT_CLINIC_OR_DEPARTMENT_OTHER)

## 2024-06-04 ENCOUNTER — Encounter (HOSPITAL_BASED_OUTPATIENT_CLINIC_OR_DEPARTMENT_OTHER): Payer: Self-pay

## 2024-06-04 ENCOUNTER — Ambulatory Visit (HOSPITAL_BASED_OUTPATIENT_CLINIC_OR_DEPARTMENT_OTHER)
Admission: RE | Admit: 2024-06-04 | Discharge: 2024-06-04 | Disposition: A | Discharge status: Home / Self Care | Source: Ambulatory Visit | Attending: Orthopedic Surgery | Admitting: Orthopedic Surgery

## 2024-06-04 DIAGNOSIS — M5416 Radiculopathy, lumbar region: Secondary | ICD-10-CM | POA: Diagnosis present

## 2024-06-04 DIAGNOSIS — M79605 Pain in left leg: Secondary | ICD-10-CM | POA: Insufficient documentation

## 2024-06-08 ENCOUNTER — Other Ambulatory Visit: Payer: Self-pay | Admitting: Internal Medicine

## 2024-06-11 ENCOUNTER — Encounter: Payer: Self-pay | Admitting: Orthopedic Surgery

## 2024-06-11 DIAGNOSIS — M5416 Radiculopathy, lumbar region: Secondary | ICD-10-CM

## 2024-06-11 NOTE — Telephone Encounter (Signed)
I called lmom

## 2024-06-12 NOTE — Telephone Encounter (Signed)
 No need to keep her appointment.  Lets see what the spine specialist says and then also it would be good to refer him to a general surgeon to evaluate the hernia.  Thanks please refer him to Dr. Georgette Poli

## 2024-06-12 NOTE — Telephone Encounter (Signed)
 Sure thx - moore will see lauren has mssg thx

## 2024-06-19 ENCOUNTER — Ambulatory Visit: Admitting: Orthopedic Surgery

## 2024-06-21 ENCOUNTER — Ambulatory Visit: Attending: Cardiology | Admitting: Cardiology

## 2024-06-21 VITALS — BP 138/76 | HR 60 | Ht 66.0 in | Wt 152.0 lb

## 2024-06-21 DIAGNOSIS — I10 Essential (primary) hypertension: Secondary | ICD-10-CM | POA: Diagnosis not present

## 2024-06-21 DIAGNOSIS — I2510 Atherosclerotic heart disease of native coronary artery without angina pectoris: Secondary | ICD-10-CM | POA: Diagnosis not present

## 2024-06-21 DIAGNOSIS — E785 Hyperlipidemia, unspecified: Secondary | ICD-10-CM | POA: Diagnosis not present

## 2024-06-21 NOTE — Patient Instructions (Signed)
 Medication Instructions:  sTOP aspirin AS PRESCRIBED BY YOUR PROVIDER *If you need a refill on your cardiac medications before your next appointment, please call your pharmacy*  Lab Work: Lipidm panel today If you have labs (blood work) drawn today and your tests are completely normal, you will receive your results only by: MyChart Message (if you have MyChart) OR A paper copy in the mail If you have any lab test that is abnormal or we need to change your treatment, we will call you to review the results.  Testing/Procedures: none  Follow-Up: At [facility], you and your health needs are our priority.  As part of our continuing mission to provide you with exceptional heart care, our providers are all part of one team.  This team includes your primary Cardiologist (physician) and Advanced Practice Providers or APPs (Physician Assistants and Nurse Practitioners) who all work together to provide you with the care you need, when you need it.  Your next appointment:   6 month(s)  Provider:   Dr. Kate  We recommend signing up for the patient portal called MyChart.  Sign up information is provided on this After Visit Summary.  MyChart is used to connect with patients for Virtual Visits (Telemedicine).  Patients are able to view lab/test results, encounter notes, upcoming appointments, etc.  Non-urgent messages can be sent to your provider as well.   To learn more about what you can do with MyChart, go to [email or web address].   Other Instructions Please check blood pressure twice a day for next week and send those reading to your provider

## 2024-06-21 NOTE — Progress Notes (Signed)
 Cardiology Office Note:    Date:  06/21/2024   ID:  Steven Gates Mon, DOB 1961-07-10, MRN [id number]  PCP:  Bobbette Coye LABOR, MD  Cardiologist:  None  Electrophysiologist:  None   Referring MD: Serene Gaile ORN, MD   Chief Complaint  Patient presents with   Coronary Artery Disease    History of Present Illness:    Steven Gates is a 63 y.o. male with a hx of diabetes, iron deficiency anemia, hypertension, hyperlipidemia, tobacco use who is referred by Dr. Serene for evaluation of family history of CAD.  Denies any chest pain, dyspnea, lower extremity edema, or palpitations.  Reports some lightheadedness but denies any syncope.  Reports he is very active, denies any exertional symptoms.  Reports occasional marijuana use.  He smoked 1 to 1.5 packs/day for about 25 years, quit in 2014.  Family history includes mother died of MI at age 40, 2 brothers had CABG in the late 50s/early 52s, and another brother had CVA in 62s.   Underwent LHC in 2011 which showed plaque in heart arteries but no stenosis.  Past Medical History:  Diagnosis Date   Allergy    mild   Arthritis    hands    Cancer (HCC)    skin cancer - spot removed from neck   Diabetes mellitus without complication (HCC)    Diverticulosis    GERD (gastroesophageal reflux disease)    Hx of adenomatous colonic polyps    x 12  on 04-19-2018   Hyperlipidemia    Hypertension    IDA (iron deficiency anemia)    Internal hemorrhoids     Past Surgical History:  Procedure Laterality Date   APPENDECTOMY     carpel tunnel Right    CERVICAL SPINE SURGERY     C3-C4 - plate   COLONOSCOPY     greater than 10 yrs  in [place]   ELBOW SURGERY Bilateral    elbow surgery x 2 - bilateral ulnar nerve   ESOPHAGOGASTRODUODENOSCOPY  02/2023   3 cm HH/gastritis/recall 3 mth   MULTIPLE TOOTH EXTRACTIONS     POLYPECTOMY      Current Medications: Current Meds  Medication Sig   esomeprazole  (NEXIUM ) 40 MG capsule TAKE 1 CAPSULE DAILY    ferrous gluconate (FERGON) 324 MG tablet Take 324 mg by mouth daily.   hydrochlorothiazide (HYDRODIURIL) 25 MG tablet Take 25 mg by mouth daily.   irbesartan (AVAPRO) 150 MG tablet Take 150 mg by mouth daily.   metFORMIN (GLUCOPHAGE) 500 MG tablet Take 1,000 mg by mouth 2 (two) times daily with a meal.   metoprolol succinate (TOPROL-XL) 100 MG 24 hr tablet Take 100 mg by mouth daily. Take with or immediately following a meal.   simvastatin (ZOCOR) 40 MG tablet Take 40 mg by mouth daily.   [DISCONTINUED] aspirin EC 81 MG tablet Take 81 mg by mouth daily.     Allergies:   Prednisone   Social History   Socioeconomic History   Marital status: Married    Spouse name: Not on file   Number of children: 1   Years of education: Not on file   Highest education level: Not on file  Occupational History   Occupation: retired  Tobacco Use   Smoking status: Former    Current packs/day: 0.00    Average packs/day: 1 pack/day for 25.0 years (25.0 ttl pk-yrs)    Types: Cigarettes    Start date: 60    Quit date: 2014  Years since quitting: 11.7   Smokeless tobacco: Never  Vaping Use   Vaping status: Never Used  Substance and Sexual Activity   Alcohol use: Never   Drug use: Not Currently    Types: Marijuana    Comment: Last use March 22 2018   Sexual activity: Not on file  Other Topics Concern   Not on file  Social History Narrative   Not on file   Social Drivers of Health   Financial Resource Strain: Low Risk  (06/12/2024)   Received from [facility]   Overall Financial Resource Strain (CARDIA)    How hard is it for you to pay for the very basics like food, housing, medical care, and heating?: Not very hard  Food Insecurity: No Food Insecurity (06/12/2024)   Received from [facility]   Hunger Vital Sign    Within the past 12 months, you worried that your food would run out before you got the money to buy more.: Never true    Within the past 12 months, the food you bought just  didn't last and you didn't have money to get more.: Never true  Transportation Needs: No Transportation Needs (06/12/2024)   Received from [facility] - Transportation    In the past 12 months, has lack of transportation kept you from medical appointments or from getting medications?: No    In the past 12 months, has lack of transportation kept you from meetings, work, or from getting things needed for daily living?: No  Physical Activity: Unknown (06/12/2024)   Received from [facility]   Exercise Vital Sign    On average, how many days per week do you engage in moderate to strenuous exercise (like a brisk walk)?: 7 days    On average, how many minutes do you engage in exercise at this level?: Patient declined  Stress: No Stress Concern Present (06/12/2024)   Received from [facility] - Occupational Stress Questionnaire    Do you feel stress - tense, restless, nervous, or anxious, or unable to sleep at night because your mind is troubled all the time - these days?: Not at all  Social Connections: Socially Integrated (06/12/2024)   Received from [facility]   Social Network    How would you rate your social network (family, work, friends)?: Good participation with social networks     Family History: The patient's family history includes Alcohol abuse in his father and mother; Anemia in his sister; Breast cancer in his sister; COPD in his sister; Cancer in his brother; Colon cancer in his sister; Colon polyps in his sister; Coronary artery disease in his sister; Diabetes in his brother, brother, and brother; Esophageal cancer in his sister; Heart disease in his brother, brother, mother, sister, and sister; Hyperlipidemia in his brother, brother, and brother; Hypertension in his brother, brother, brother, father, mother, and sister; Lung cancer in his sister; Peptic Ulcer in his sister; Peripheral vascular disease in his sister; Skin cancer  in his brother; Stroke in his brother and brother. There is no history of Stomach cancer or Rectal cancer.  ROS:   Please see the history of present illness.     All other systems reviewed and are negative.  EKGs/Labs/Other Studies Reviewed:    The following studies were reviewed today:   EKG:   06/21/2024: Normal sinus rhythm, rate 60  Recent Labs: No results found for requested labs within last 365 days.  Recent  Lipid Panel No results found for: CHOL, TRIG, HDL, CHOLHDL, VLDL, LDLCALC, LDLDIRECT  Physical Exam:    VS:  BP 138/76 (BP Location: Right Arm, Patient Position: Sitting)   Pulse 60   Ht 5' 6 (1.676 m)   Wt 152 lb (68.9 kg)   SpO2 98%   BMI 24.53 kg/m     Wt Readings from Last 3 Encounters:  06/21/24 152 lb (68.9 kg)  04/29/24 155 lb 11.2 oz (70.6 kg)  10/02/23 162 lb (73.5 kg)     GEN:  Well nourished, well developed in no acute distress HEENT: Normal NECK: No JVD; No carotid bruits LYMPHATICS: No lymphadenopathy CARDIAC: RRR, no murmurs, rubs, gallops RESPIRATORY:  Clear to auscultation without rales, wheezing or rhonchi  ABDOMEN: Soft, non-tender, non-distended MUSCULOSKELETAL:  No edema; No deformity  SKIN: Warm and dry NEUROLOGIC:  Alert and oriented x 3 PSYCHIATRIC:  Normal affect   ASSESSMENT:    1. Coronary artery disease involving native coronary artery of native heart without angina pectoris   2. Hyperlipidemia, unspecified hyperlipidemia type   3. Essential hypertension    PLAN:    CAD: Underwent LHC in 2011 which showed plaque in heart arteries but no stenosis - He has been taking aspirin 81 mg daily.  Given his issues with iron deficiency anemia, would recommend discontinuing - Continue simvastatin 40 mg daily.  Check lipid panel.  Goal LDL less than 70  Hypertension: On irbesartan 150 mg daily and hydrochlorothiazide 25 mg daily and Toprol-XL 100 mg daily.  BP mildly elevated in clinic today.  Asked to check BP twice  daily for next week and let us  know results  Hyperlipidemia: On simvastatin 40 mg daily.  Update lipid panel  T2DM: On metformin.  A1c 6.1% on 05/2024  Leg pain: ABIs 04/11/2024 were normal.   RTC in 6 months  Medication Adjustments/Labs and Tests Ordered: Current medicines are reviewed at length with the patient today.  Concerns regarding medicines are outlined above.  Orders Placed This Encounter  Procedures   Lipid panel   EKG 12-Lead   No orders of the defined types were placed in this encounter.   Patient Instructions  Medication Instructions:  sTOP aspirin AS PRESCRIBED BY YOUR PROVIDER *If you need a refill on your cardiac medications before your next appointment, please call your pharmacy*  Lab Work: Lipidm panel today If you have labs (blood work) drawn today and your tests are completely normal, you will receive your results only by: MyChart Message (if you have MyChart) OR A paper copy in the mail If you have any lab test that is abnormal or we need to change your treatment, we will call you to review the results.  Testing/Procedures: none  Follow-Up: At [facility], you and your health needs are our priority.  As part of our continuing mission to provide you with exceptional heart care, our providers are all part of one team.  This team includes your primary Cardiologist (physician) and Advanced Practice Providers or APPs (Physician Assistants and Nurse Practitioners) who all work together to provide you with the care you need, when you need it.  Your next appointment:   6 month(s)  Provider:   Dr. Kate  We recommend signing up for the patient portal called MyChart.  Sign up information is provided on this After Visit Summary.  MyChart is used to connect with patients for Virtual Visits (Telemedicine).  Patients are able to view lab/test results, encounter notes, upcoming appointments, etc.  Non-urgent messages can be sent to your provider as  well.   To learn more about what you can do with MyChart, go to [email or web address].   Other Instructions Please check blood pressure twice a day for next week and send those reading to your provider           Signed, Steven LITTIE Nanas, MD  06/21/2024 5:41 PM    [facility]

## 2024-06-22 LAB — LIPID PANEL
Chol/HDL Ratio: 2.8 ratio (ref 0.0–5.0)
Cholesterol, Total: 161 mg/dL (ref 100–199)
HDL: 57 mg/dL (ref >39)
LDL Chol Calc (NIH): 73 mg/dL (ref 0–99)
Triglycerides: 186 mg/dL — ABNORMAL HIGH (ref 0–149)
VLDL Cholesterol Cal: 31 mg/dL (ref 5–40)

## 2024-06-24 ENCOUNTER — Ambulatory Visit: Payer: Self-pay | Admitting: Cardiology

## 2024-06-24 DIAGNOSIS — E785 Hyperlipidemia, unspecified: Secondary | ICD-10-CM

## 2024-06-26 MED ORDER — ATORVASTATIN CALCIUM 40 MG PO TABS: 40.0000 mg | ORAL_TABLET | Freq: Every day | ORAL | 3 refills | Status: AC

## 2024-07-17 ENCOUNTER — Other Ambulatory Visit (INDEPENDENT_AMBULATORY_CARE_PROVIDER_SITE_OTHER)

## 2024-07-17 ENCOUNTER — Ambulatory Visit: Admitting: Orthopedic Surgery

## 2024-07-17 VITALS — BP 119/71 | HR 65 | Ht 65.0 in | Wt 147.5 lb

## 2024-07-17 DIAGNOSIS — M545 Low back pain, unspecified: Secondary | ICD-10-CM | POA: Diagnosis not present

## 2024-07-17 DIAGNOSIS — R200 Anesthesia of skin: Secondary | ICD-10-CM | POA: Diagnosis not present

## 2024-07-17 NOTE — Progress Notes (Signed)
 Orthopedic Spine Surgery Office Note  Assessment: Patient is a 63 y.o. male with a constellation of symptoms including left hip pain left leg numbness, instability that occurs with walking or crossing the legs.  No significant stenosis seen on his lumbar MRI   Plan: -Patient's MRI only shows mild foraminal stenosis on the left at L3.  His symptoms are not consistent with an L3 distribution.  He is also had 2 injections that give him any relief.  Accordingly, I do not feel that his symptoms are coming from the L3 nerve root.  He does not have any significant stenosis so I do not feel that there is a spine origin of this symptom.  I recommended neurology for further workup.  Referral provided to today -He can return to my office on an as needed basis   Patient expressed understanding of the plan and all questions were answered to the patient's satisfaction.   ___________________________________________________________________________   History:  Patient is a 63 y.o. male who presents today for lumbar spine.  Patient has had over a year of several symptoms.  He has had some left posterior hip pain.  He notices it when he is walking or if he crosses his legs.  He will also notice numbness going down the posterior aspect of his thigh and leg.  His leg will sometimes give out when he gets numb like this.  It is resulted in several near falls.  He does not have any symptoms in his right lower extremity.  He has tried 2 injections to the L3 nerve root with Dr. Eldonna.  Neither injection gave him any relief.   Weakness: Yes, symptoms the left leg gives out when he is having symptoms Symptoms of imbalance: Yes, sometimes his left leg gives out on him Paresthesias and numbness: Yes, gets numbness and paresthesias down the posterior aspect of his left thigh and leg.  No other numbness or paresthesias Bowel or bladder incontinence: Denies Saddle anesthesia: Denies   Treatments tried: Lumbar steroid  injections  Review of systems: Denies fevers and chills, night sweats, unexplained weight loss, pain that wakes him at night. Has a history of basal cell carcinoma  Past medical history: Basal cell carcinoma GERD DM HTN HLD Hemorrhoids  Allergies: prednisone  Past surgical history:  Hernia repair C3/4 cervical fusion Appendectomy Basal cell carcinoma excision Cubital tunnel release Carpal tunnel release  Social history: Denies use of nicotine product (smoking, vaping, patches, smokeless) Alcohol use: Denies Reports marijuana use, denies other recreational drug use   Physical Exam:  BMI of 24.6  General: no acute distress, appears stated age Neurologic: alert, answering questions appropriately, following commands Respiratory: unlabored breathing on room air, symmetric chest rise Psychiatric: appropriate affect, normal cadence to speech   MSK (spine):  -Strength exam      Left  Right EHL    5/5  5/5 TA    5/5  5/5 GSC    5/5  5/5 Knee extension  5/5  5/5 Hip flexion   5/5  5/5  -Sensory exam    Sensation intact to light touch in L3-S1 nerve distributions of bilateral lower extremities  -Achilles DTR: 2/4 on the left, 2/4 on the right -Patellar tendon DTR: 2/4 on the left, 2/4 on the right  -Straight leg raise: Negative bilaterally -Clonus: no beats bilaterally -Negative Hoffmann bilaterally -No interosseous muscle wasting seen -Negative grip and release test -Gait normal  -Left hip exam: No pain through range of motion -Right hip exam: No  pain to range of motion  Imaging: XRs of the lumbar spine from 07/17/2024 were independently reviewed and interpreted, showing small anterior osteophyte formation at L3/4 and L4/5.  No other significant degenerative changes seen.  No evidence of instability in flexion/extension views.  No fracture or dislocation seen.  MRI of the lumbar spine from 01/17/2024 was independently reviewed and interpreted, showing mild  foraminal stenosis at L3/4 on the left.  No other significant stenosis seen.   Patient name: Steven Gates Patient MRN: [id number] Date of visit: 07/17/24

## 2024-07-18 ENCOUNTER — Encounter: Payer: Self-pay | Admitting: Orthopedic Surgery

## 2024-07-18 ENCOUNTER — Encounter: Payer: Self-pay | Admitting: Neurology

## 2024-07-22 ENCOUNTER — Encounter: Payer: Self-pay | Admitting: Radiology

## 2024-07-22 ENCOUNTER — Other Ambulatory Visit: Payer: Self-pay | Admitting: Radiology

## 2024-07-22 ENCOUNTER — Telehealth: Payer: Self-pay | Admitting: Orthopedic Surgery

## 2024-07-22 DIAGNOSIS — M545 Low back pain, unspecified: Secondary | ICD-10-CM

## 2024-07-22 DIAGNOSIS — R200 Anesthesia of skin: Secondary | ICD-10-CM

## 2024-07-22 NOTE — Telephone Encounter (Signed)
 Patient called and said that he went to the his referral that you sent but they are booked out til February. Can you find a different place because he said he can't wait that long. CB# [phone]

## 2024-07-22 NOTE — Telephone Encounter (Signed)
See MyChart message back to patient

## 2024-07-23 ENCOUNTER — Encounter: Payer: Self-pay | Admitting: Radiology

## 2024-08-29 NOTE — Progress Notes (Signed)
 "  Initial neurology clinic note  Reason for Evaluation: Consultation requested by Georgina Ozell LABOR, MD for an opinion regarding left leg numbness. My final recommendations will be communicated back to the requesting physician by way of shared medical record or letter to requesting physician via [place]  mail.  HPI: This is Mr. Steven Gates, a 63 y.o. 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 The patient is accompanied by wife, Steven Gates.  Patient has had problems with his left leg for about 2 years. He felt his left hip was weak and numb. He describes the numbness as from the hip/femur joint going all the way to his toes. He feels like his leg is going to give out. Symptoms have gotten worse over 2 years. He sometimes will feel bee sting like sensation that will wake him up at night, most of the time when he lays on his hip. He will have itching sometimes as well.   He has seen Dr. Addie, Dr. Georgina, and Dr. Eldonna. He has had injections in the back (?unclear if he had an injection in his hip) that provided no relief.   Symptoms are affecting his quality of life because he cannot walk far without his leg giving out and having to rest. After resting he can go a little farther. He can walk about 80 feet generally but with difficulty. If he crosses his left leg over the right, it will hurt, feeling like something is being pulled. His foot will then start feeling like it is going to sleep. Straightening his left leg out will do the same. He will cross the left foot over the right to get feeling back in the foot.  Of note, around 2017 he started having numbness and weakness in his hands. He had cervical spine fusion, right carpal tunnel release, and bilateral ulnar nerve release in bilateral  elbows.  He does report any fever, night sweats. His weight is stable.   EtOH use: none  Restrictive diet? no Family history of neuropathy/myopathy/NM/neurologic disease? None clear  Of note, there is a strong family history of vascular disease.   MEDICATIONS:  Outpatient Encounter Medications as of 09/06/2024  Medication Sig   atorvastatin  (LIPITOR) 40 MG tablet Take 1 tablet (40 mg total) by mouth daily.   cyanocobalamin (VITAMIN B12) 1000 MCG tablet Take by mouth daily.   esomeprazole  (NEXIUM ) 40 MG capsule Take 1 capsule (40 mg total) by mouth daily. NEEDS APPT FOR FURTHER REFILLS   ferrous gluconate (FERGON) 324 MG tablet Take 324 mg by mouth daily.   hydrochlorothiazide (HYDRODIURIL) 25 MG tablet Take 25 mg by mouth daily.   irbesartan (AVAPRO) 150 MG tablet Take 150 mg by mouth daily.   metFORMIN (GLUCOPHAGE) 500 MG tablet Take 1,000 mg by mouth 2 (two) times daily with a meal.   metoprolol succinate (TOPROL-XL) 100 MG 24 hr tablet Take 100 mg by mouth daily. Take with or immediately following a meal.   vitamin B-12 (CYANOCOBALAMIN) 100 MCG tablet Take 100 mcg by mouth daily.   [DISCONTINUED] esomeprazole  (NEXIUM ) 40 MG capsule TAKE 1 CAPSULE DAILY   No facility-administered encounter medications on file as of 09/06/2024.    PAST MEDICAL HISTORY: Past Medical History:  Diagnosis Date   Allergy    mild   Arthritis  hands    Cancer (HCC)    skin cancer - spot removed from neck   Diabetes mellitus without complication (HCC)    Diverticulosis    GERD (gastroesophageal reflux disease)    Hx of adenomatous colonic polyps    x 12  on 04-19-2018   Hyperlipidemia    Hypertension    IDA (iron deficiency anemia)    Internal hemorrhoids     PAST SURGICAL HISTORY: Past Surgical History:  Procedure Laterality Date   APPENDECTOMY     carpel tunnel Right    CERVICAL SPINE SURGERY     C3-C4 - plate   COLONOSCOPY     greater than 10 yrs  in [place]   ELBOW SURGERY  Bilateral    elbow surgery x 2 - bilateral ulnar nerve   ESOPHAGOGASTRODUODENOSCOPY  02/2023   3 cm HH/gastritis/recall 3 mth   MULTIPLE TOOTH EXTRACTIONS     POLYPECTOMY      ALLERGIES: Allergies[1]  FAMILY HISTORY: Family History  Problem Relation Age of Onset   Hypertension Mother    Alcohol abuse Mother    Heart disease Mother    Hypertension Father    Alcohol abuse Father    Colon cancer Sister        in her 63s   Colon polyps Sister    Breast cancer Sister    Esophageal cancer Sister    Hypertension Sister    Heart disease Sister    Peripheral vascular disease Sister    Lung cancer Sister    Heart disease Sister    Coronary artery disease Sister    COPD Sister    Peptic Ulcer Sister    Anemia Sister    Skin cancer Brother    Heart disease Brother    Diabetes Brother    Stroke Brother    Hypertension Brother    Hyperlipidemia Brother    Cancer Brother        type unknown   Heart disease Brother    Hypertension Brother    Hyperlipidemia Brother    Diabetes Brother    Stroke Brother    Hypertension Brother    Hyperlipidemia Brother    Diabetes Brother    Stomach cancer Neg Hx    Rectal cancer Neg Hx     SOCIAL HISTORY: Social History[2] Social History   Social History Narrative   Are you right handed or left handed? Right   Are you currently employed ?    What is your current occupation? retired   Do you live at home alone?   Who lives with you? wife   What type of home do you live in: 1 story or 2 story? one   Caffiene 3 cups a day      OBJECTIVE: PHYSICAL EXAM: BP 118/69   Pulse 69   Ht 5' 5 (1.651 m)   Wt 150 lb (68 kg)   SpO2 98%   BMI 24.96 kg/m   General: General appearance: Awake and alert. No distress. Cooperative with exam.  Skin: No obvious rash or jaundice. HEENT: Atraumatic. Anicteric. Lungs: Non-labored breathing on room air  Heart: Regular Extremities: No edema. No obvious deformity.  Musculoskeletal: No obvious  joint swelling. Psych: Affect appropriate.  Neurological: Mental Status: Alert. Speech fluent. No pseudobulbar affect Cranial Nerves: CNII: No RAPD. Visual fields grossly intact. CNIII, IV, VI: PERRL. No nystagmus. EOMI. CN V: Facial sensation intact bilaterally to fine touch. CN VII: Facial muscles symmetric and strong. No ptosis at rest.  CN VIII: Hearing grossly intact bilaterally. CN IX: No hypophonia. CN X: Palate elevates symmetrically. CN XI: Full strength shoulder shrug bilaterally. CN XII: Tongue protrusion full and midline. No atrophy or fasciculations. No significant dysarthria Motor: Tone is normal. No fasciculations in any extremities. No atrophy.  Individual muscle group testing (MRC grade out of 5):  Movement     Neck flexion 5    Neck extension 5     Right Left   Shoulder abduction 5 5   Shoulder adduction 5 5   Shoulder ext rotation 5 5   Shoulder int rotation 5 5   Elbow flexion 5 5   Elbow extension 5 5   Finger abduction - FDI 5 5   Finger abduction - ADM 5 5   Finger extension 5 5   Finger distal flexion - 2/3 5 5    Finger distal flexion - 4/5 5 5    Thumb flexion - FPL 5 5   Thumb abduction - APB 5 5    Hip flexion 5 5   Hip extension 5 5   Hip adduction 5 5   Hip abduction 5 5   Knee extension 5 5   Knee flexion 5 5   Dorsiflexion 5 5   Plantarflexion 5 5   Inversion 5 5   Eversion 5 5    Reflexes:  Right Left   Bicep 2+ 2+   Tricep 2+ 2+   BrRad 2+ 2+   Knee 2+ 2+   Ankle 2+ 2+    Pathological Reflexes: Babinski: flexor response bilaterally Hoffman: absent bilaterally Troemner: absent bilaterally Sensation: Pinprick: Intact in all extremities including low back and bilateral hips Coordination: Intact finger-to- nose-finger bilaterally. Romberg negative. Gait: Able to rise from chair with arms crossed unassisted. Normal, narrow-based gait. Able to tandem walk. Able to walk on toes and heels.  Lab and Test Review: Internal  labs: Lipid panel (06/21/24): tChol 161, LDL 73, TG 186 tTG ab (12/20/22): < 1  External labs: HbA1c (06/12/24): 6.1 CBC w/ diff (03/04/24): unremarkable  Imaging/Procedures: MRI lumbar spine wo contrast (01/17/24): FINDINGS: Segmentation:  Normal on the comparison CT.   Alignment: Chronic straightening of lumbar lordosis is stable. Subtle anterolisthesis of L5 on S1 has not significantly changed.   Vertebrae: Normal background bone marrow signal. Faint and degenerative appearing marrow edema of the L4 inferior and S1 superior endplates on the left (series 104, image 12). Maintained vertebral body height. Intact visible sacrum and SI joints. No other acute osseous abnormality.   Conus medullaris and cauda equina: Conus extends to the L1 level. No lower spinal cord or conus signal abnormality. Spinal canal. Normal cauda equina nerve roots. Capacious   Paraspinal and other soft tissues: Negative.   Disc levels:   T11-T12 and   T12-L1:  Negative.   L1-L2:  Minimal disc desiccation and disc bulging.  No stenosis.   L2-L3:  Negative.   L3-L4: Minimal disc desiccation. Mild circumferential disc bulge, mostly anterior. Mild facet hypertrophy. No spinal or lateral recess stenosis. Asymmetric mild to moderate left and mild right L3 neural foraminal stenosis related to broad-based disc and endplate spurring (series 102, image 13 on the left) has mildly progressed.   L4-L5: Similar minor disc desiccation, circumferential disc bulge and endplate spurring. Mild facet and ligament flavum hypertrophy. No spinal or lateral recess stenosis. Borderline to mild bilateral L4 neural foraminal stenosis has not significantly changed since 09/30/2013.   L5-S1: More pronounced disc desiccation. Mild circumferential disc bulge  and endplate spurring. Mild facet and ligament flavum hypertrophy. No spinal or lateral recess stenosis. Only borderline to mild L5 neural foraminal stenosis with minimal  progression since 2014.   IMPRESSION: 1. 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 Capacious spinal canal with no spinal or lateral recess stenosis.   2. Up to moderate left L3 neural foraminal stenosis related to disc and endplate degeneration which does appear increased from a 2014 MRI. Other mild lumbar neural foraminal stenosis has not significantly changed.   3. Mild degenerative marrow edema at the L4 and S1 endplates asymmetric to the left.  ABI (04/11/24): Summary:  Right: Resting right ankle-brachial index is within normal range. The  right toe-brachial index is normal.   Left: Resting left ankle-brachial index is within normal range. The left  toe-brachial index is abnormal.   MRI pelvis wo contrast (06/04/24): IMPRESSION: 1. Early hip joint degenerative changes bilaterally but no stress fracture or AVN. 2. Mild bilateral peritrochanteric tendinosis without bursitis. 3. Intact bony pelvis. 4. Stable bilateral inguinal hernias containing fat, left slightly larger than right.  Lumbar spine xray (07/17/24): small anterior osteophyte formation at L3/4 and L4/5. No other significant degenerative changes seen.  No evidence of  instability in flexion/extension views.  No fracture or dislocation seen.   ASSESSMENT: Steven Gates is a 63 y.o. male who presents for evaluation of left hip and leg pain, numbness, and weakness. 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 His neurological examination is essentially normal today. Available diagnostic data is significant for MRI lumbar spine without high grade stenosis. MRI pelvis showed some early bilateral hip degeneration. The etiology of patient's symptoms is currently unclear. Hip pathology and lumbar spine pathology has been evaluated. I will defer to Dr. Addie and Dr. Georgina on whether they feel this could  be contributing to symptoms, but obviously either is possible. I will evaluate with EMG to determine if there is any evidence of nerve involvement. The symptoms do not fit a clear root or nerve distribution, but nerve or root compression is still possible. I considered irritation of the left lateral femoral cutaneous nerve given the location of pain near the hip, but this would not radiate down and is not a motor nerve that would cause give way weakness.   PLAN: -EMG - LLE   -Return to clinic to be determined  The impression above as well as the plan as outlined below were extensively discussed with the patient (in the company of wife) who voiced understanding. All questions were answered to their satisfaction.  When available, results of the above investigations and possible further recommendations will be communicated to the patient via telephone/MyChart. Patient to call office if not contacted after expected testing turnaround time.   Total time spent reviewing records, interview, history/exam, documentation, and coordination of care on day of encounter:  55 min   Thank you for allowing me to participate in patient's care.  If I can answer any additional questions, I would be pleased to do so.  Venetia Potters, MD   CC: Corrington, Coye LABOR, MD [address] [place] [address]  CC: Referring provider: Georgina Ozell LABOR, MD [address] [place],  [place] [address]     [1]  Allergies Allergen Reactions   Prednisone Other (See Comments)    Irritable  [2]  Social History Tobacco Use  Smoking status: Former    Current packs/day: 0.00    Average packs/day: 1 pack/day for 25.0 years (25.0 ttl pk-yrs)    Types: Cigarettes    Start date: [birth year]    Quit date: 2014    Years since quitting: 11.9   Smokeless tobacco: Never  Vaping Use   Vaping status: Never Used  Substance Use Topics   Alcohol use: Not Currently   Drug use: Not Currently    Types: Marijuana    Comment: occ    "

## 2024-09-01 ENCOUNTER — Other Ambulatory Visit: Payer: Self-pay | Admitting: Internal Medicine

## 2024-09-06 ENCOUNTER — Encounter: Payer: Self-pay | Admitting: Neurology

## 2024-09-06 ENCOUNTER — Ambulatory Visit: Admitting: Neurology

## 2024-09-06 VITALS — BP 118/69 | HR 69 | Ht 65.0 in | Wt 150.0 lb

## 2024-09-06 DIAGNOSIS — M79605 Pain in left leg: Secondary | ICD-10-CM | POA: Diagnosis not present

## 2024-09-06 DIAGNOSIS — R29898 Other symptoms and signs involving the musculoskeletal system: Secondary | ICD-10-CM | POA: Diagnosis not present

## 2024-09-06 NOTE — Patient Instructions (Signed)
 ELECTROMYOGRAM AND NERVE CONDUCTION STUDIES (EMG/NCS) INSTRUCTIONS  How to Prepare The neurologist conducting the EMG will need to know if you have certain medical conditions. Tell the neurologist and other EMG lab personnel if you: Have a pacemaker or any other electrical medical device Take blood-thinning medications Have hemophilia, a blood-clotting disorder that causes prolonged bleeding Bathing Take a shower or bath shortly before your exam in order to remove oils from your skin. Don't apply lotions or creams before the exam.  What to Expect You'll likely be asked to change into a hospital gown for the procedure and lie down on an examination table. The following explanations can help you understand what will happen during the exam.  Electrodes. The neurologist or a technician places surface electrodes at various locations on your skin depending on where you're experiencing symptoms. Or the neurologist may insert needle electrodes at different sites depending on your symptoms.  Sensations. The electrodes will at times transmit a tiny electrical current that you may feel as a twinge or spasm. The needle electrode may cause discomfort or pain that usually ends shortly after the needle is removed. If you are concerned about discomfort or pain, you may want to talk to the neurologist about taking a short break during the exam.  Instructions. During the needle EMG, the neurologist will assess whether there is any spontaneous electrical activity when the muscle is at rest - activity that isn't present in healthy muscle tissue - and the degree of activity when you slightly contract the muscle.  He or she will give you instructions on resting and contracting a muscle at appropriate times. Depending on what muscles and nerves the neurologist is examining, he or she may ask you to change positions during the exam.  After your EMG You may experience some temporary, minor bruising where the needle  electrode was inserted into your muscle. This bruising should fade within several days. If it persists, contact your primary care doctor.    The physicians and staff at [facility] are committed to providing excellent care. You may receive a survey requesting feedback about your experience at our office. We strive to receive "very good" responses to the survey questions. If you feel that your experience would prevent you from giving the office a "very good " response, please contact our office to try to remedy the situation. We may be reached at [phone]. Thank you for taking the time out of your busy day to complete the survey.  Jacquelyne Balint, MD [facility]

## 2024-09-23 ENCOUNTER — Ambulatory Visit: Payer: Self-pay | Admitting: Neurology

## 2024-09-23 ENCOUNTER — Telehealth: Payer: Self-pay | Admitting: Neurology

## 2024-09-23 ENCOUNTER — Telehealth: Payer: Self-pay | Admitting: Orthopedic Surgery

## 2024-09-23 DIAGNOSIS — R29898 Other symptoms and signs involving the musculoskeletal system: Secondary | ICD-10-CM

## 2024-09-23 DIAGNOSIS — M79605 Pain in left leg: Secondary | ICD-10-CM | POA: Diagnosis not present

## 2024-09-23 NOTE — Telephone Encounter (Signed)
 Discussed the results of patient's EMG after the procedure today. EMG of the left leg was normal with no clear explanation of patient's left hip or leg pain.  All questions were answered. He will follow up with me as needed.  Venetia Potters, MD [facility]

## 2024-09-23 NOTE — Telephone Encounter (Signed)
 Thanks Steven Gates looks like it is coming from his back

## 2024-09-23 NOTE — Telephone Encounter (Signed)
 Patient called. Says he had the study done. What does he need to do next? Would like a call

## 2024-09-23 NOTE — Procedures (Signed)
" °  [facility]  [address] [place], [address]  [place], [place] [address] Tel: [phone] Fax: [phone] Test Date:  09/23/2024  [name] DOB: 1961-04-30 Physician: Venetia Potters, MD  Sex: Male Height: 5' 5 Ref Phys: Venetia Potters, MD  ID#: [id number] Temp: 33.5C Technician:    History: This is a 64 year old male with hip pain and numbness, tingling, and weakness of his left hip and lower limb.   NCV & EMG Findings: Extensive electrodiagnostic evaluation of the left lower limb shows: Left sural and superficial peroneal/fibular sensory responses are within normal limits. Left peroneal/fibular (EDB) and tibial (AH) motor responses are within normal limits. Left H reflex latency is within normal limits. There is no evidence of active or chronic motor axon loss changes affecting any of the tested muscles. Motor unit configuration and recruitment pattern is within normal limits.  Impression: This is a normal study of the left lower limb. In particular, there is no electrodiagnostic evidence of a left lumbosacral (L2-S1) radiculopathy, large fiber sensorimotor neuropathy, or myopathy.    ___________________________ Venetia Potters, MD    Nerve Conduction Studies Motor Nerve Results    Latency Amplitude F-Lat Segment Distance CV Comment  Site (ms) Norm (mV) Norm (ms)  (cm) (m/s) Norm   Left Fibular (EDB) Motor  Ankle 3.4  < 6.0 4.0  > 2.5        Bel fib head 9.5 - 3.5 -  Bel fib head-Ankle 30 49  > 40   Pop fossa 11.6 - 3.4 -  Pop fossa-Bel fib head 9 43 -   Left Tibial (AH) Motor  Ankle 3.3  < 6.0 11.4  > 4.0        Knee 10.9 - 7.6 -  Knee-Ankle 38.5 51  > 40    Sensory Sites    Neg Peak Lat Amplitude (O-P) Segment Distance Velocity Comment  Site (ms) Norm (V) Norm  (cm) (ms)   Left Superficial Fibular Sensory  14 cm-Ankle 2.6  < 4.6 6  > 3 14 cm-Ankle 14    Left Sural Sensory  Calf-Lat mall 3.7  < 4.6 8  > 3 Calf-Lat mall 14     H-Reflex Results     M-Lat H Lat H Neg Amp H-M Lat  Site (ms) (ms) Norm (mV) (ms)  Left Tibial H-Reflex  Pop fossa 5.5 28.5  < 35.0 1.06 23.0   Electromyography   Side Muscle Ins.Act Fibs Fasc Recrt Amp Dur Poly Activation Comment  Left Tib ant Nml Nml Nml Nml Nml Nml Nml Nml N/A  Left Gastroc MH Nml Nml Nml Nml Nml Nml Nml Nml N/A  Left Rectus fem Nml Nml Nml Nml Nml Nml Nml Nml N/A  Left Iliacus Nml Nml Nml Nml Nml Nml Nml Nml N/A  Left Biceps fem SH Nml Nml Nml Nml Nml Nml Nml Nml N/A  Left Gluteus med Nml Nml Nml Nml Nml Nml Nml Nml N/A  Left Lumbar PSP lower Nml Nml Nml Nml Nml Nml Nml Nml N/A      Waveforms:  Motor      Sensory      H-Reflex    "

## 2024-09-26 ENCOUNTER — Telehealth: Payer: Self-pay | Admitting: Orthopedic Surgery

## 2024-09-26 NOTE — Telephone Encounter (Signed)
 Duplicate. See other note.

## 2024-09-26 NOTE — Telephone Encounter (Signed)
 Pt called extremely upset and doesn't know what to do about his situation. He said he has been waiting to hear back from Lauren since Monday. Call back number is [phone]

## 2024-09-26 NOTE — Telephone Encounter (Signed)
 Pls send him to [facility] for second opinion  - this is most likely coming from his back thx

## 2024-09-30 NOTE — Telephone Encounter (Signed)
 Has hernia on most recent MRI pelvis.  Left greater than right.  That usually does not cause burning pain but I think it would be good to also have him seen by general surgeon in addition to Dr. Colon..  If that workup is negative and Dr. Colon concurs that this is not coming from his back then at that point I would do a diagnostic injection into the left hip; however, he had 2 negative MRI scans in 2025 of the left hip with only mild degenerative changes present.

## 2024-10-01 ENCOUNTER — Other Ambulatory Visit: Payer: Self-pay

## 2024-10-01 DIAGNOSIS — R200 Anesthesia of skin: Secondary | ICD-10-CM

## 2024-10-01 DIAGNOSIS — M545 Low back pain, unspecified: Secondary | ICD-10-CM

## 2024-10-01 DIAGNOSIS — M5416 Radiculopathy, lumbar region: Secondary | ICD-10-CM

## 2024-10-02 NOTE — Telephone Encounter (Signed)
 Will work on this

## 2024-10-31 ENCOUNTER — Ambulatory Visit: Admitting: Neurology
